# Patient Record
Sex: Male | Born: 1935 | Race: White | Marital: Married | Smoking: Never smoker
Health system: Southern US, Community
[De-identification: ages and names within clinical notes are randomized; demographics above are authoritative.]

## PROBLEM LIST (undated history)

## (undated) DIAGNOSIS — E119 Type 2 diabetes mellitus without complications: Secondary | ICD-10-CM

## (undated) DIAGNOSIS — I10 Essential (primary) hypertension: Secondary | ICD-10-CM

## (undated) HISTORY — PX: BACK SURGERY: SHX140

## (undated) HISTORY — PX: OTHER SURGICAL HISTORY: SHX169

---

## 2013-03-23 ENCOUNTER — Ambulatory Visit
Admission: RE | Admit: 2013-03-23 | Discharge: 2013-03-23 | Disposition: A | Discharge status: Home / Self Care | Payer: Medicare Other | Source: Ambulatory Visit | Attending: Family Medicine | Admitting: Family Medicine

## 2013-03-23 ENCOUNTER — Other Ambulatory Visit: Payer: Self-pay | Admitting: Family Medicine

## 2013-03-23 DIAGNOSIS — R059 Cough, unspecified: Secondary | ICD-10-CM

## 2013-03-23 DIAGNOSIS — R05 Cough: Secondary | ICD-10-CM

## 2013-03-23 DIAGNOSIS — R0989 Other specified symptoms and signs involving the circulatory and respiratory systems: Secondary | ICD-10-CM

## 2016-07-19 ENCOUNTER — Ambulatory Visit (HOSPITAL_COMMUNITY)
Admission: EM | Admit: 2016-07-19 | Discharge: 2016-07-19 | Disposition: A | Discharge status: Home / Self Care | Payer: Medicare Other | Attending: Internal Medicine | Admitting: Internal Medicine

## 2016-07-19 ENCOUNTER — Encounter (HOSPITAL_COMMUNITY): Payer: Self-pay | Admitting: Emergency Medicine

## 2016-07-19 DIAGNOSIS — J209 Acute bronchitis, unspecified: Secondary | ICD-10-CM | POA: Diagnosis not present

## 2016-07-19 DIAGNOSIS — J019 Acute sinusitis, unspecified: Secondary | ICD-10-CM

## 2016-07-19 HISTORY — DX: Type 2 diabetes mellitus without complications: E11.9

## 2016-07-19 HISTORY — DX: Essential (primary) hypertension: I10

## 2016-07-19 MED ORDER — AZITHROMYCIN 250 MG PO TABS: 250.0000 mg | ORAL_TABLET | Freq: Every day | ORAL | 0 refills | Status: DC

## 2016-07-19 MED ORDER — ALBUTEROL SULFATE HFA 108 (90 BASE) MCG/ACT IN AERS: 2.0000 | INHALATION_SPRAY | RESPIRATORY_TRACT | 0 refills | Status: DC | PRN

## 2016-07-19 MED ORDER — PREDNISONE 50 MG PO TABS: 50.0000 mg | ORAL_TABLET | Freq: Every day | ORAL | 0 refills | Status: DC

## 2016-07-19 NOTE — ED Triage Notes (Signed)
[facility] a complaint of a cough that started as a sore throat 1 week ago. The patient stated that he now has a productive cough, congestion and nasal drainage. The patient also reported rib cage pain under his left arm that he believed to be caused from coughing.

## 2016-07-19 NOTE — ED Provider Notes (Signed)
[facility]    CSN: [id number] Arrival date & time: 07/19/16  1521     History   Chief Complaint Chief Complaint  Patient presents with  . Cough    HPI Sean Barrett is a 82 y.o. male. He has a history of bronchitis. He presents today with a 10 day history of coughing spells, chest tightness, runny/congested nose. Sore throat initially. No fever. Still having malaise, fatigue. Hurts in his left chest with cough. [place] from [place], and does not have an inhaler with him.  HPI  Past Medical History:  Diagnosis Date  . Diabetes mellitus without complication (HCC)   . Hypertension     Past Surgical History:  Procedure Laterality Date  . BACK SURGERY    . rotator cuff surgery         Home Medications    Prior to Admission medications   Medication Sig Start Date End Date Taking? Authorizing Provider  amLODipine (NORVASC) 10 MG tablet Take 10 mg by mouth daily.   Yes Historical Provider, MD  aspirin EC 81 MG tablet Take 81 mg by mouth daily.   Yes Historical Provider, MD  atorvastatin (LIPITOR) 40 MG tablet Take 40 mg by mouth daily.   Yes Historical Provider, MD  carvedilol (COREG) 12.5 MG tablet Take 12.5 mg by mouth 2 (two) times daily with a meal.   Yes Historical Provider, MD  clopidogrel (PLAVIX) 75 MG tablet Take 75 mg by mouth daily.   Yes Historical Provider, MD  gabapentin (NEURONTIN) 300 MG capsule Take 300 mg by mouth 3 (three) times daily.   Yes Historical Provider, MD  metFORMIN (GLUCOPHAGE) 1000 MG tablet Take 1,000 mg by mouth 2 (two) times daily with a meal.   Yes Historical Provider, MD  MULTIPLE VITAMIN PO Take by mouth.   Yes Historical Provider, MD  vitamin B-12 (CYANOCOBALAMIN) 1000 MCG tablet Take 1,000 mcg by mouth every other day.   Yes Historical Provider, MD  albuterol (PROVENTIL HFA;VENTOLIN HFA) 108 (90 Base) MCG/ACT inhaler Inhale 2 puffs into the lungs every 4 (four) hours as needed for wheezing or shortness of breath.  07/19/16   Eustace MooreLaura W Cashton Hosley, MD  azithromycin (ZITHROMAX) 250 MG tablet Take 1 tablet (250 mg total) by mouth daily. Take first 2 tablets together, then 1 every day until finished. 07/19/16   Eustace MooreLaura W Marrion Finan, MD  predniSONE (DELTASONE) 50 MG tablet Take 1 tablet (50 mg total) by mouth daily. 07/19/16   Eustace MooreLaura W Deniqua Perry, MD    Family History History reviewed. No pertinent family history.  Social History Social History  Substance Use Topics  . Smoking status: Never Smoker  . Smokeless tobacco: Never Used  . Alcohol use No     Allergies   Tenex [guanfacine]   Review of Systems Review of Systems  All other systems reviewed and are negative.    Physical Exam Triage Vital Signs ED Triage Vitals  Enc Vitals Group     BP 07/19/16 1659 173/69     Pulse Rate 07/19/16 1659 71     Resp 07/19/16 1659 20     Temp 07/19/16 1659 98 F (36.7 C)     Temp Source 07/19/16 1659 Oral     SpO2 07/19/16 1659 100 %     Weight --      Height --      Pain Score 07/19/16 1704 5   Updated Vital Signs BP 173/69 (BP Location: Left Arm)   Pulse 71  Temp 98 F (36.7 C) (Oral)   Resp 20   SpO2 100%  Physical Exam  Constitutional: He is oriented to person, place, and time. No distress.  Alert, nicely groomed  HENT:  Head: Atraumatic.  Bilateral ears are waxy and TMs are quite dull, no erythema Marked nasal congestion, left greater than right Throat is red with postnasal drainage  Eyes:  Conjugate gaze, no eye redness/drainage  Neck: Neck supple.  Cardiovascular: Normal rate and regular rhythm.   Pulmonary/Chest: No respiratory distress.  Coarse but symmetric breath sounds throughout Deep breath causes coughing  Abdominal: He exhibits no distension.  Musculoskeletal: Normal range of motion.  Neurological: He is alert and oriented to person, place, and time.  Skin: Skin is warm and dry.  No cyanosis  Nursing note and vitals reviewed.    UC Treatments / Results    Procedures Procedures (including critical care time)      None today  Final Clinical Impressions(s) / UC Diagnoses   Final diagnoses:  Acute bronchitis, unspecified organism  Acute sinusitis with symptoms > 10 days   Anticipate gradual improvement in cough/congestion over the next several days.  Cough may take a couple weeks to completely resolve.  Recheck for new fever>100.5, increasing phlegm production, or if not starting to improve in a few days.  New Prescriptions New Prescriptions   ALBUTEROL (PROVENTIL HFA;VENTOLIN HFA) 108 (90 BASE) MCG/ACT INHALER    Inhale 2 puffs into the lungs every 4 (four) hours as needed for wheezing or shortness of breath.   AZITHROMYCIN (ZITHROMAX) 250 MG TABLET    Take 1 tablet (250 mg total) by mouth daily. Take first 2 tablets together, then 1 every day until finished.   PREDNISONE (DELTASONE) 50 MG TABLET    Take 1 tablet (50 mg total) by mouth daily.     Eustace Moore, MD 07/22/16 [phone]

## 2016-07-19 NOTE — Discharge Instructions (Addendum)
Anticipate gradual improvement in cough/congestion over the next several days.  Cough may take a couple weeks to completely resolve.  Recheck for new fever>100.5, increasing phlegm production, or if not starting to improve in a few days.

## 2017-02-12 ENCOUNTER — Encounter (HOSPITAL_COMMUNITY): Payer: Self-pay | Admitting: Emergency Medicine

## 2017-02-12 ENCOUNTER — Ambulatory Visit (HOSPITAL_COMMUNITY)
Admission: EM | Admit: 2017-02-12 | Discharge: 2017-02-12 | Disposition: A | Discharge status: Home / Self Care | Payer: Medicare Other | Attending: Internal Medicine | Admitting: Internal Medicine

## 2017-02-12 DIAGNOSIS — Z889 Allergy status to unspecified drugs, medicaments and biological substances status: Secondary | ICD-10-CM | POA: Diagnosis not present

## 2017-02-12 DIAGNOSIS — R42 Dizziness and giddiness: Secondary | ICD-10-CM | POA: Diagnosis not present

## 2017-02-12 DIAGNOSIS — I10 Essential (primary) hypertension: Secondary | ICD-10-CM

## 2017-02-12 MED ORDER — MECLIZINE HCL 12.5 MG PO TABS: 12.5000 mg | ORAL_TABLET | Freq: Three times a day (TID) | ORAL | 0 refills | Status: AC | PRN

## 2017-02-12 NOTE — Discharge Instructions (Signed)
Nice to meet you. Your exam is normal for Neuro causes though that is not always 100% however based on your presentation appears most indicative of vertigo vs. Allergies and ear fluid.  May use 1/2-1 of the Antivert for dizziness every 8 hours as needed. Caution as may cause sleepiness.  Also add back Claritin  daily and may use Mucinex (plain) as directed this will help your ear fluid.  If you become worse, with headaches or changes in vision or weakness, please go to the ER for further evaluation.

## 2017-02-12 NOTE — ED Provider Notes (Signed)
CSN: [id number]     Arrival date & time 02/12/17  1145 History   First MD Initiated Contact with Patient 02/12/17 1313     Chief Complaint  Patient presents with  . Dizziness   (Consider location/radiation/quality/duration/timing/severity/associated sxs/prior Treatment) 83 yo male presents with episodic dizziness x 2 weeks. Patient feels dizzy and nauseas when moving his head and feels that his "ears are full". No prior history of dizziness. +history of allergies, off his Medications for 2-3 months. Some post nasal drip is noted. Otherwise he feels well. He denies change in vision, weakness or paraesthesias. He has a chronic tremor of his neck and extremities which are unchanged. See remainder of ROS. No injury has preceeded this issue.        Past Medical History:  Diagnosis Date  . Diabetes mellitus without complication (HCC)   . Hypertension    Past Surgical History:  Procedure Laterality Date  . BACK SURGERY    . rotator cuff surgery     History reviewed. No pertinent family history. Social History  Substance Use Topics  . Smoking status: Never Smoker  . Smokeless tobacco: Never Used  . Alcohol use No    Review of Systems  Constitutional: Negative for fatigue, fever and unexpected weight change.  HENT: Positive for ear pain and postnasal drip. Negative for congestion, rhinorrhea and sinus pressure.   Eyes: Positive for discharge and itching.  Cardiovascular: Negative.   Neurological: Positive for dizziness, tremors and light-headedness. Negative for seizures, syncope, facial asymmetry, speech difficulty, weakness, numbness and headaches.  Psychiatric/Behavioral: Negative.  Negative for confusion, sleep disturbance and suicidal ideas. The patient is not nervous/anxious.     Allergies  Tenex [guanfacine]  Home Medications   Prior to Admission medications   Medication Sig Start Date End Date Taking? Authorizing Provider  amLODipine (NORVASC) 10 MG tablet Take 10 mg by  mouth daily.    [provider]  aspirin EC 81 MG tablet Take 81 mg by mouth daily.    [provider]  atorvastatin (LIPITOR) 40 MG tablet Take 40 mg by mouth daily.    [provider]  carvedilol (COREG) 12.5 MG tablet Take 12.5 mg by mouth 2 (two) times daily with a meal.    [provider]  clopidogrel (PLAVIX) 75 MG tablet Take 75 mg by mouth daily.    [provider]  gabapentin (NEURONTIN) 300 MG capsule Take 300 mg by mouth 3 (three) times daily.    [provider]  meclizine (ANTIVERT) 12.5 MG tablet Take 1 tablet (12.5 mg total) by mouth 3 (three) times daily as needed for dizziness. 02/12/17   Riki Sheer, PA-C  metFORMIN (GLUCOPHAGE) 1000 MG tablet Take 1,000 mg by mouth 2 (two) times daily with a meal.    [provider]  MULTIPLE VITAMIN PO Take by mouth.    [provider]  vitamin B-12 (CYANOCOBALAMIN) 1000 MCG tablet Take 1,000 mcg by mouth every other day.    [provider]   Meds Ordered and Administered this Visit  Medications - No data to display  BP (!) 151/57 (BP Location: Right Arm)   Pulse (!) 59   Temp 98 F (36.7 C) (Oral)   Resp 16   SpO2 98%  No data found.   Physical Exam  Constitutional: He is oriented to person, place, and time. He appears well-developed and well-nourished. No distress.  HENT:  Head: Normocephalic and atraumatic.  Cerumen bilaterally with small retro TM fluid,  no retractations or erythema are noted  Eyes: EOM are normal. Pupils are equal, round, and reactive to light. Right eye exhibits discharge. Left eye exhibits discharge.  Clear drainage (mild and watery) bilaterally  Neck: Normal range of motion. Neck supple.  Cardiovascular: Normal rate and regular rhythm.   Pulmonary/Chest: Effort normal and breath sounds normal.  Neurological: He is alert and oriented to person, place, and time. No cranial nerve deficit. He exhibits normal muscle tone.  Coordination normal.  Skin: Skin is warm and dry. He is not diaphoretic.  Psychiatric: His behavior is normal.  Nursing note and vitals reviewed.   Urgent Care Course     Procedures (including critical care time)  Labs Review Labs Reviewed - No data to display  Imaging Review No results found.   Visual Acuity Review  Right Eye Distance:   Left Eye Distance:   Bilateral Distance:    Right Eye Near:   Left Eye Near:    Bilateral Near:         MDM   1. Dizziness   2. H/O seasonal allergies   3. Essential hypertension    1. Suspect mild vertigo vs. Allergies-Attempted Dix hall-pike procedure without reproduction of nystagmus, but some mild nausea and no resolution of symptoms. Ears with fullness with clear fluid. No neuro deficit is noted on exam which is reassuring.  Suggest a trial of Antivert as needed. Restart Claritin daily for some allergies noted by exam. We discussed f/u with his ENT if continues or certainly if worsens with Neurologic symptoms then go to the ED. FU with his PCP for essential HTN.     Riki SheerYoung, Donyell Carrell G, [place]-C 02/12/17 [phone]

## 2017-02-12 NOTE — ED Triage Notes (Signed)
[facility] onset of dizziness that has caused some nasuea x 2 weeks. The patient reported that it is worse when he moves his head and he has some ear fullness as well.

## 2019-10-02 ENCOUNTER — Emergency Department (HOSPITAL_COMMUNITY): Payer: Medicare Other

## 2019-10-02 ENCOUNTER — Observation Stay (HOSPITAL_COMMUNITY): Payer: Medicare Other

## 2019-10-02 ENCOUNTER — Observation Stay (HOSPITAL_COMMUNITY)
Admission: EM | Admit: 2019-10-02 | Discharge: 2019-10-03 | Disposition: A | Discharge status: Home / Self Care | Payer: Medicare Other | Attending: Internal Medicine | Admitting: Internal Medicine

## 2019-10-02 DIAGNOSIS — E119 Type 2 diabetes mellitus without complications: Secondary | ICD-10-CM | POA: Diagnosis not present

## 2019-10-02 DIAGNOSIS — R29810 Facial weakness: Secondary | ICD-10-CM | POA: Insufficient documentation

## 2019-10-02 DIAGNOSIS — I10 Essential (primary) hypertension: Secondary | ICD-10-CM | POA: Insufficient documentation

## 2019-10-02 DIAGNOSIS — R55 Syncope and collapse: Principal | ICD-10-CM | POA: Diagnosis present

## 2019-10-02 DIAGNOSIS — R110 Nausea: Secondary | ICD-10-CM | POA: Insufficient documentation

## 2019-10-02 DIAGNOSIS — R269 Unspecified abnormalities of gait and mobility: Secondary | ICD-10-CM

## 2019-10-02 DIAGNOSIS — M545 Low back pain: Secondary | ICD-10-CM | POA: Insufficient documentation

## 2019-10-02 DIAGNOSIS — R200 Anesthesia of skin: Secondary | ICD-10-CM | POA: Diagnosis not present

## 2019-10-02 DIAGNOSIS — G450 Vertebro-basilar artery syndrome: Secondary | ICD-10-CM | POA: Insufficient documentation

## 2019-10-02 DIAGNOSIS — Z8673 Personal history of transient ischemic attack (TIA), and cerebral infarction without residual deficits: Secondary | ICD-10-CM | POA: Diagnosis not present

## 2019-10-02 DIAGNOSIS — I4891 Unspecified atrial fibrillation: Secondary | ICD-10-CM | POA: Diagnosis not present

## 2019-10-02 DIAGNOSIS — Z79899 Other long term (current) drug therapy: Secondary | ICD-10-CM | POA: Insufficient documentation

## 2019-10-02 DIAGNOSIS — E785 Hyperlipidemia, unspecified: Secondary | ICD-10-CM | POA: Diagnosis not present

## 2019-10-02 DIAGNOSIS — Z7984 Long term (current) use of oral hypoglycemic drugs: Secondary | ICD-10-CM | POA: Diagnosis not present

## 2019-10-02 DIAGNOSIS — W19XXXA Unspecified fall, initial encounter: Secondary | ICD-10-CM | POA: Diagnosis not present

## 2019-10-02 DIAGNOSIS — Z20828 Contact with and (suspected) exposure to other viral communicable diseases: Secondary | ICD-10-CM | POA: Insufficient documentation

## 2019-10-02 DIAGNOSIS — Z7982 Long term (current) use of aspirin: Secondary | ICD-10-CM | POA: Insufficient documentation

## 2019-10-02 DIAGNOSIS — Z7902 Long term (current) use of antithrombotics/antiplatelets: Secondary | ICD-10-CM | POA: Diagnosis not present

## 2019-10-02 LAB — COMPREHENSIVE METABOLIC PANEL
ALT: 29 U/L (ref 0–44)
AST: 25 U/L (ref 15–41)
Albumin: 3.8 g/dL (ref 3.5–5.0)
Alkaline Phosphatase: 76 U/L (ref 38–126)
Anion gap: 10 (ref 5–15)
BUN: 24 mg/dL — ABNORMAL HIGH (ref 8–23)
CO2: 27 mmol/L (ref 22–32)
Calcium: 9.3 mg/dL (ref 8.9–10.3)
Chloride: 100 mmol/L (ref 98–111)
Creatinine, Ser: 1.36 mg/dL — ABNORMAL HIGH (ref 0.61–1.24)
GFR calc Af Amer: 55 mL/min — ABNORMAL LOW (ref >60)
GFR calc non Af Amer: 48 mL/min — ABNORMAL LOW (ref >60)
Glucose, Bld: 261 mg/dL — ABNORMAL HIGH (ref 70–99)
Potassium: 4.1 mmol/L (ref 3.5–5.1)
Sodium: 137 mmol/L (ref 135–145)
Total Bilirubin: 1.1 mg/dL (ref 0.3–1.2)
Total Protein: 6.4 g/dL — ABNORMAL LOW (ref 6.5–8.1)

## 2019-10-02 LAB — I-STAT CHEM 8, ED
BUN: 26 mg/dL — ABNORMAL HIGH (ref 8–23)
Calcium, Ion: 1.19 mmol/L (ref 1.15–1.40)
Chloride: 98 mmol/L (ref 98–111)
Creatinine, Ser: 1.2 mg/dL (ref 0.61–1.24)
Glucose, Bld: 256 mg/dL — ABNORMAL HIGH (ref 70–99)
HCT: 47 % (ref 39.0–52.0)
Hemoglobin: 16 g/dL (ref 13.0–17.0)
Potassium: 4 mmol/L (ref 3.5–5.1)
Sodium: 138 mmol/L (ref 135–145)
TCO2: 30 mmol/L (ref 22–32)

## 2019-10-02 LAB — DIFFERENTIAL
Abs Immature Granulocytes: 0.03 10*3/uL (ref 0.00–0.07)
Basophils Absolute: 0 10*3/uL (ref 0.0–0.1)
Basophils Relative: 1 %
Eosinophils Absolute: 0.3 10*3/uL (ref 0.0–0.5)
Eosinophils Relative: 3 %
Immature Granulocytes: 0 %
Lymphocytes Relative: 30 %
Lymphs Abs: 2.4 10*3/uL (ref 0.7–4.0)
Monocytes Absolute: 0.5 10*3/uL (ref 0.1–1.0)
Monocytes Relative: 7 %
Neutro Abs: 4.8 10*3/uL (ref 1.7–7.7)
Neutrophils Relative %: 59 %

## 2019-10-02 LAB — CBC
HCT: 45.8 % (ref 39.0–52.0)
Hemoglobin: 15.5 g/dL (ref 13.0–17.0)
MCH: 31.3 pg (ref 26.0–34.0)
MCHC: 33.8 g/dL (ref 30.0–36.0)
MCV: 92.5 fL (ref 80.0–100.0)
Platelets: 279 10*3/uL (ref 150–400)
RBC: 4.95 MIL/uL (ref 4.22–5.81)
RDW: 12.5 % (ref 11.5–15.5)
WBC: 8.1 10*3/uL (ref 4.0–10.5)
nRBC: 0 % (ref 0.0–0.2)

## 2019-10-02 LAB — CBG MONITORING, ED: Glucose-Capillary: 161 mg/dL — ABNORMAL HIGH (ref 70–99)

## 2019-10-02 LAB — PROTIME-INR
INR: 1.1 (ref 0.8–1.2)
Prothrombin Time: 14 seconds (ref 11.4–15.2)

## 2019-10-02 LAB — GLUCOSE, CAPILLARY: Glucose-Capillary: 143 mg/dL — ABNORMAL HIGH (ref 70–99)

## 2019-10-02 LAB — APTT: aPTT: 32 seconds (ref 24–36)

## 2019-10-02 LAB — SARS CORONAVIRUS 2 (TAT 6-24 HRS): SARS Coronavirus 2: NEGATIVE

## 2019-10-02 MED ORDER — SENNOSIDES-DOCUSATE SODIUM 8.6-50 MG PO TABS: 1.0000 | ORAL_TABLET | Freq: Every evening | ORAL | Status: DC | PRN

## 2019-10-02 MED ORDER — ACETAMINOPHEN 160 MG/5ML PO SOLN: 650.0000 mg | ORAL | Status: DC | PRN

## 2019-10-02 MED ORDER — STROKE: EARLY STAGES OF RECOVERY BOOK: Freq: Once | Status: DC

## 2019-10-02 MED ORDER — MULTIPLE VITAMIN PO TABS: ORAL_TABLET | Freq: Every morning | ORAL | Status: DC

## 2019-10-02 MED ORDER — CLOPIDOGREL BISULFATE 75 MG PO TABS
75.0000 mg | ORAL_TABLET | Freq: Every day | ORAL | Status: DC
  Administered 2019-10-03: 08:00:00 75 mg via ORAL
  Filled 2019-10-02: qty 1

## 2019-10-02 MED ORDER — ACETAMINOPHEN 325 MG PO TABS: 650.0000 mg | ORAL_TABLET | ORAL | Status: DC | PRN

## 2019-10-02 MED ORDER — ADULT MULTIVITAMIN W/MINERALS CH
1.0000 | ORAL_TABLET | Freq: Every day | ORAL | Status: DC
  Administered 2019-10-03: 08:00:00 1 via ORAL
  Filled 2019-10-02: qty 1

## 2019-10-02 MED ORDER — GABAPENTIN 300 MG PO CAPS: 300.0000 mg | ORAL_CAPSULE | Freq: Three times a day (TID) | ORAL | Status: DC

## 2019-10-02 MED ORDER — ENOXAPARIN SODIUM 40 MG/0.4ML ~~LOC~~ SOLN
40.0000 mg | SUBCUTANEOUS | Status: DC
  Administered 2019-10-02 – 2019-10-03 (×2): 40 mg via SUBCUTANEOUS
  Filled 2019-10-02 (×2): qty 0.4

## 2019-10-02 MED ORDER — INSULIN ASPART 100 UNIT/ML ~~LOC~~ SOLN
0.0000 [IU] | Freq: Three times a day (TID) | SUBCUTANEOUS | Status: DC
  Administered 2019-10-03 (×2): 2 [IU] via SUBCUTANEOUS

## 2019-10-02 MED ORDER — ASPIRIN 325 MG PO TABS
325.0000 mg | ORAL_TABLET | Freq: Every day | ORAL | Status: DC
  Administered 2019-10-02 – 2019-10-03 (×2): 325 mg via ORAL
  Filled 2019-10-02 (×2): qty 1

## 2019-10-02 MED ORDER — ATORVASTATIN CALCIUM 80 MG PO TABS: 80.0000 mg | ORAL_TABLET | Freq: Every day | ORAL | Status: DC

## 2019-10-02 MED ORDER — SODIUM CHLORIDE 0.9% FLUSH
3.0000 mL | Freq: Once | INTRAVENOUS | Status: AC
  Administered 2019-10-02: 13:00:00 3 mL via INTRAVENOUS

## 2019-10-02 MED ORDER — IOHEXOL 350 MG/ML SOLN
75.0000 mL | Freq: Once | INTRAVENOUS | Status: AC | PRN
  Administered 2019-10-02: 75 mL via INTRAVENOUS

## 2019-10-02 MED ORDER — ACETAMINOPHEN 650 MG RE SUPP: 650.0000 mg | RECTAL | Status: DC | PRN

## 2019-10-02 MED ORDER — VITAMIN B-12 1000 MCG PO TABS: 1000.0000 ug | ORAL_TABLET | ORAL | Status: DC

## 2019-10-02 MED ORDER — INSULIN ASPART 100 UNIT/ML ~~LOC~~ SOLN: 0.0000 [IU] | Freq: Every day | SUBCUTANEOUS | Status: DC

## 2019-10-02 NOTE — ED Notes (Signed)
Pharmacy tech reported to this RN that patient's son reported to pharmacy tech that he heard pt's head hit the floor when he fell.

## 2019-10-02 NOTE — Progress Notes (Signed)
EEG complete - results pending 

## 2019-10-02 NOTE — ED Triage Notes (Addendum)
To ED via [facility] from son's house-- visiting from [place]-- had a syncopal episode while standing at counter-- no hx of this Has a small abrasion on left occipital area- no bleeding--  Speech clear-- equal grips, no drift in arms-- states that he was too weak to raise arms when EMS was at the house   Has left facial droop and numbness in left side of face  Partial hearing loss in right ear-- hx of

## 2019-10-02 NOTE — ED Provider Notes (Signed)
[facility] EMERGENCY DEPARTMENT Provider Note   CSN: [id number] Arrival date & time:     An emergency department physician performed an initial assessment on this suspected stroke patient at 34.  History Chief Complaint  Patient presents with  . Code Stroke    Sean Barrett is a 85 y.o. male.  HPI Patient presented to me as a code stroke.  Stroke called by EMS.  Reportedly had been standing at home at the counter.  He is visiting [place].  Had a syncopal episode.  States he felt nauseous before he fell.  Did not feel his heart racing or going slow.  Had difficulty raising his arms.  Per EMS had facial droop on the left and numbness on left side of face.  No headache.  He is on Plavix.  Now complaining of pain in his lower back.  No history of atrial fibrillation.  States he did have a narrowing in the back of his head before there was talk of a stent but was not done.  States he does have a rod in his back.    Past Medical History:  Diagnosis Date  . Diabetes mellitus without complication (HCC)   . Hypertension     There are no problems to display for this patient.   Past Surgical History:  Procedure Laterality Date  . BACK SURGERY    . rotator cuff surgery         No family history on file.  Social History   Tobacco Use  . Smoking status: Never Smoker  . Smokeless tobacco: Never Used  Substance Use Topics  . Alcohol use: No  . Drug use: No    Home Medications Prior to Admission medications   Medication Sig Start Date End Date Taking? Authorizing Provider  amLODipine (NORVASC) 10 MG tablet Take 10 mg by mouth daily.    [provider]  aspirin EC 81 MG tablet Take 81 mg by mouth daily.    [provider]  atorvastatin (LIPITOR) 40 MG tablet Take 40 mg by mouth daily.    [provider]  carvedilol (COREG) 12.5 MG tablet Take 12.5 mg by mouth 2 (two) times daily with a meal.    [provider]  clopidogrel  (PLAVIX) 75 MG tablet Take 75 mg by mouth daily.    [provider]  gabapentin (NEURONTIN) 300 MG capsule Take 300 mg by mouth 3 (three) times daily.    [provider]  meclizine (ANTIVERT) 12.5 MG tablet Take 1 tablet (12.5 mg total) by mouth 3 (three) times daily as needed for dizziness. 02/12/17   Riki Sheer, PA-C  metFORMIN (GLUCOPHAGE) 1000 MG tablet Take 1,000 mg by mouth 2 (two) times daily with a meal.    [provider]  MULTIPLE VITAMIN PO Take by mouth.    [provider]  vitamin B-12 (CYANOCOBALAMIN) 1000 MCG tablet Take 1,000 mcg by mouth every other day.    [provider]    Allergies    Tenex [guanfacine]  Review of Systems   Review of Systems  Constitutional: Negative for appetite change.  HENT: Negative for congestion.   Respiratory: Negative for shortness of breath.   Cardiovascular: Negative for chest pain.  Gastrointestinal: Positive for nausea. Negative for abdominal pain.  Musculoskeletal: Positive for back pain.  Skin: Negative for rash.  Neurological: Positive for syncope.  Psychiatric/Behavioral: Negative for confusion.    Physical Exam Updated Vital Signs BP 111/61   Pulse  67   Resp 13   Wt 80.1 kg   SpO2 98%   Physical Exam Vitals and nursing note reviewed.  HENT:     Head: Normocephalic.  Eyes:     Pupils: Pupils are equal, round, and reactive to light.  Cardiovascular:     Rate and Rhythm: Normal rate and regular rhythm.  Pulmonary:     Breath sounds: Normal breath sounds.  Musculoskeletal:     Cervical back: Neck supple.     Comments: Tenderness over lumbar spine.  Skin:    General: Skin is warm.     Capillary Refill: Capillary refill takes less than 2 seconds.  Neurological:     Mental Status: He is alert and oriented to person, place, and time.     Comments: Awake and pleasant.  Some wavering with straight leg raise on the left side.  Good on the right.  Grip strength symmetric.   Face symmetric.  Sensation symmetric.  Complete NIH scoring done by neurology     ED Results / Procedures / Treatments   Labs (all labs ordered are listed, but only abnormal results are displayed) Labs Reviewed  COMPREHENSIVE METABOLIC PANEL - Abnormal; Notable for the following components:      Result Value   Glucose, Bld 261 (*)    BUN 24 (*)    Creatinine, Ser 1.36 (*)    Total Protein 6.4 (*)    GFR calc non Af Amer 48 (*)    GFR calc Af Amer 55 (*)    All other components within normal limits  I-STAT CHEM 8, ED - Abnormal; Notable for the following components:   BUN 26 (*)    Glucose, Bld 256 (*)    All other components within normal limits  PROTIME-INR  APTT  CBC  DIFFERENTIAL  CBG MONITORING, ED    EKG EKG Interpretation  Date/Time:  Monday October 02 2019 12:00:21 EST Ventricular Rate:  68 PR Interval:    QRS Duration: 78 QT Interval:  572 QTC Calculation: 609 R Axis:   11 Text Interpretation: Atrial fibrillation Abnormal R-wave progression, early transition Borderline T wave abnormalities Prolonged QT interval Confirmed by Benjiman CorePickering, Zaniah Titterington [phone]) on 10/02/2019 12:39:45 PM   Radiology CT Code Stroke CTA Head W/WO contrast  Result Date: 10/02/2019 CLINICAL DATA:  Stroke, follow-up. EXAM: CT ANGIOGRAPHY HEAD AND NECK TECHNIQUE: Multidetector CT imaging of the head and neck was performed using the standard protocol during bolus administration of intravenous contrast. Multiplanar CT image reconstructions and MIPs were obtained to evaluate the vascular anatomy. Carotid stenosis measurements (when applicable) are obtained utilizing NASCET criteria, using the distal internal carotid diameter as the denominator. CONTRAST:  75mL OMNIPAQUE IOHEXOL 350 MG/ML SOLN COMPARISON:  Noncontrast head CT performed earlier the same day 10/02/2019 FINDINGS: CTA NECK FINDINGS Aortic arch: Standard aortic branching. Scattered soft and calcified plaque within the visualized aortic arch  and proximal major branch vessels of the neck. Right carotid system: CCA patent without stenosis. Prominent soft and calcified plaque at the carotid bifurcation and within the proximal ICA. There is resultant 50-60% stenosis at the origin of the ICA. Calcified plaques also present within the distal cervical ICA, although without significant narrowing at this site. Left carotid system: CCA patent without stenosis. Moderate soft and calcified plaque at the carotid bifurcation and within the proximal ICA. Resultant less than 50% stenosis within the proximal ICA. Distal to this, the ICA is patent within the neck without stenosis. Vertebral arteries: The left vertebral artery  is slightly dominant. Calcified plaque at the origin of the non dominant right vertebral artery results in at least moderate/severe ostial stenosis. Calcified plaque also present at the origin of the dominant left vertebral artery without significant ostial stenosis. More distally the bilateral vertebral arteries are patent within the neck without significant stenosis. Skeleton: No acute bony abnormality. Cervical spondylosis without high-grade bony spinal canal narrowing. Other neck: No soft tissue neck mass or cervical lymphadenopathy. Upper chest: No consolidation within the imaged lung apices. Review of the MIP images confirms the above findings CTA HEAD FINDINGS Anterior circulation: There is dense calcified plaque throughout the bilateral internal carotid artery siphons. There is moderate/severe narrowing of the paraclinoid segments bilaterally (greater on the left). The M1 right middle cerebral artery is patent without significant stenosis. Atherosclerotic irregularity of right M2 branches. Most notably, there is a moderate focal stenosis within a proximal to mid M2 right MCA branch (series 12, image 13). The right anterior cerebral artery is patent without significant proximal stenosis. The M1 left middle cerebral artery is patent without  significant stenosis. Atherosclerotic irregularity of M2 left MCA branches. This includes mild-to-moderate stenosis at the origin of a left M2 MCA branch vessel (series 11, image 20). The left anterior cerebral artery is patent without significant proximal stenosis. No intracranial aneurysm is identified. Posterior circulation: Mixed plaque throughout the intracranial vertebral arteries bilaterally. Sites of up to moderate stenosis within the dominant intracranial left vertebral artery. Sites of up to moderate/severe stenosis within the intracranial right vertebral artery. The basilar artery is patent without significant stenosis. The right posterior cerebral artery is patent. Moderate atherosclerotic irregularity throughout the majority of the P2 right posterior cerebral artery. The distal P1 left posterior cerebral artery is poorly delineated as are much of the P2 and P3 left PCA segments. Findings consistent with high-grade stenosis and/or partial occlusion. There is some flow seen within the distal left posterior cerebral artery. Faint enhancement within a small left posterior communicating artery is visualized. No right posterior communicating artery is seen. Venous sinuses: Within limitations of contrast timing, no convincing thrombus. Anatomic variants: As described Review of the MIP images confirms the above findings IMPRESSION: CTA neck: 1. The common carotid, internal carotid and vertebral arteries are patent within the neck bilaterally. 2. Mixed plaque results in 50-60% stenosis within the proximal right ICA, and less than 50% stenosis of the proximal left ICA. 3. Calcified plaque results in moderate/severe ostial stenosis at the origin of the non-dominant right vertebral artery. CTA head: 1. Intracranial atherosclerotic disease, most notably as follows. 2. The distal P1 left posterior cerebral artery and much of the P2 and P3 left PCA segments are poorly delineated. Findings consistent with high-grade  stenosis and/or partial occlusion. Some enhancement is seen within the distal left PCA. 3. Moderate atherosclerotic irregularity of the P2 right PCA. 4. Mixed plaque within the intracranial vertebral arteries with sites of up to moderate/severe stenosis on the right, and up to moderate stenosis on the left. 5. Dense calcified plaque results in moderate/severe stenoses within the paraclinoid internal carotid arteries (greater on the left). 6. Moderate focal stenosis within a mid to distal M2 right MCA branch vessel. 7. Mild/moderate focal stenosis at the origin of an M2 left MCA branch vessel. Electronically Signed   By: Jackey Loge DO   On: 10/02/2019 12:08   CT Code Stroke CTA Neck W/WO contrast  Result Date: 10/02/2019 CLINICAL DATA:  Stroke, follow-up. EXAM: CT ANGIOGRAPHY HEAD AND NECK TECHNIQUE: Multidetector CT imaging  of the head and neck was performed using the standard protocol during bolus administration of intravenous contrast. Multiplanar CT image reconstructions and MIPs were obtained to evaluate the vascular anatomy. Carotid stenosis measurements (when applicable) are obtained utilizing NASCET criteria, using the distal internal carotid diameter as the denominator. CONTRAST:  75mL OMNIPAQUE IOHEXOL 350 MG/ML SOLN COMPARISON:  Noncontrast head CT performed earlier the same day 10/02/2019 FINDINGS: CTA NECK FINDINGS Aortic arch: Standard aortic branching. Scattered soft and calcified plaque within the visualized aortic arch and proximal major branch vessels of the neck. Right carotid system: CCA patent without stenosis. Prominent soft and calcified plaque at the carotid bifurcation and within the proximal ICA. There is resultant 50-60% stenosis at the origin of the ICA. Calcified plaques also present within the distal cervical ICA, although without significant narrowing at this site. Left carotid system: CCA patent without stenosis. Moderate soft and calcified plaque at the carotid bifurcation and  within the proximal ICA. Resultant less than 50% stenosis within the proximal ICA. Distal to this, the ICA is patent within the neck without stenosis. Vertebral arteries: The left vertebral artery is slightly dominant. Calcified plaque at the origin of the non dominant right vertebral artery results in at least moderate/severe ostial stenosis. Calcified plaque also present at the origin of the dominant left vertebral artery without significant ostial stenosis. More distally the bilateral vertebral arteries are patent within the neck without significant stenosis. Skeleton: No acute bony abnormality. Cervical spondylosis without high-grade bony spinal canal narrowing. Other neck: No soft tissue neck mass or cervical lymphadenopathy. Upper chest: No consolidation within the imaged lung apices. Review of the MIP images confirms the above findings CTA HEAD FINDINGS Anterior circulation: There is dense calcified plaque throughout the bilateral internal carotid artery siphons. There is moderate/severe narrowing of the paraclinoid segments bilaterally (greater on the left). The M1 right middle cerebral artery is patent without significant stenosis. Atherosclerotic irregularity of right M2 branches. Most notably, there is a moderate focal stenosis within a proximal to mid M2 right MCA branch (series 12, image 13). The right anterior cerebral artery is patent without significant proximal stenosis. The M1 left middle cerebral artery is patent without significant stenosis. Atherosclerotic irregularity of M2 left MCA branches. This includes mild-to-moderate stenosis at the origin of a left M2 MCA branch vessel (series 11, image 20). The left anterior cerebral artery is patent without significant proximal stenosis. No intracranial aneurysm is identified. Posterior circulation: Mixed plaque throughout the intracranial vertebral arteries bilaterally. Sites of up to moderate stenosis within the dominant intracranial left vertebral  artery. Sites of up to moderate/severe stenosis within the intracranial right vertebral artery. The basilar artery is patent without significant stenosis. The right posterior cerebral artery is patent. Moderate atherosclerotic irregularity throughout the majority of the P2 right posterior cerebral artery. The distal P1 left posterior cerebral artery is poorly delineated as are much of the P2 and P3 left PCA segments. Findings consistent with high-grade stenosis and/or partial occlusion. There is some flow seen within the distal left posterior cerebral artery. Faint enhancement within a small left posterior communicating artery is visualized. No right posterior communicating artery is seen. Venous sinuses: Within limitations of contrast timing, no convincing thrombus. Anatomic variants: As described Review of the MIP images confirms the above findings IMPRESSION: CTA neck: 1. The common carotid, internal carotid and vertebral arteries are patent within the neck bilaterally. 2. Mixed plaque results in 50-60% stenosis within the proximal right ICA, and less than 50% stenosis of the  proximal left ICA. 3. Calcified plaque results in moderate/severe ostial stenosis at the origin of the non-dominant right vertebral artery. CTA head: 1. Intracranial atherosclerotic disease, most notably as follows. 2. The distal P1 left posterior cerebral artery and much of the P2 and P3 left PCA segments are poorly delineated. Findings consistent with high-grade stenosis and/or partial occlusion. Some enhancement is seen within the distal left PCA. 3. Moderate atherosclerotic irregularity of the P2 right PCA. 4. Mixed plaque within the intracranial vertebral arteries with sites of up to moderate/severe stenosis on the right, and up to moderate stenosis on the left. 5. Dense calcified plaque results in moderate/severe stenoses within the paraclinoid internal carotid arteries (greater on the left). 6. Moderate focal stenosis within a mid to  distal M2 right MCA branch vessel. 7. Mild/moderate focal stenosis at the origin of an M2 left MCA branch vessel. Electronically Signed   By: Jackey Loge DO   On: 10/02/2019 12:08   CT Lumbar Spine Wo Contrast  Result Date: 10/02/2019 CLINICAL DATA:  Low back pain after syncopal episode resulting in a fall today. Initial encounter. EXAM: CT LUMBAR SPINE WITHOUT CONTRAST TECHNIQUE: Multidetector CT imaging of the lumbar spine was performed without intravenous contrast administration. Multiplanar CT image reconstructions were also generated. COMPARISON:  None. FINDINGS: Segmentation: Standard. Alignment: Normal. Vertebrae: No acute fracture or focal pathologic process. The patient is status post L4-5 laminectomy and fusion with unilateral pedicle screws and stabilization bars in place on the left. No evidence of loosening are other complication. Paraspinal and other soft tissues: Atherosclerosis is noted. Otherwise negative. Disc levels: T9-10: Negative. T10-11: Negative. T11-12: Negative. T12-L1: Negative. L1-2: Shallow disc bulge without stenosis. L2-3: Shallow disc bulge and vacuum disc phenomenon. Mild endplate spur. The central canal is open. Mild to moderate foraminal narrowing is more notable on the left. L3-4: Shallow disc bulge and ligamentum flavum thickening. The central canal is open. Moderate bilateral foraminal narrowing. L4-5: Status post laminectomy and fusion.  No stenosis. L5-S1: Shallow disc bulge and mild facet degenerative change. No stenosis. IMPRESSION: Negative for fracture or other acute abnormality. The central canal appears open at all levels. Scattered foraminal narrowing is most notable at L3-4 where it appears moderate bilaterally. Electronically Signed   By: Drusilla Kanner M.D.   On: 10/02/2019 11:47   CT HEAD CODE STROKE WO CONTRAST  Result Date: 10/02/2019 CLINICAL DATA:  Code stroke. Focal neuro deficit, greater than 6 hours, stroke suspected. Additional history provided:  Last known normal 10:30 a.m., left leg weakness. EXAM: CT HEAD WITHOUT CONTRAST TECHNIQUE: Contiguous axial images were obtained from the base of the skull through the vertex without intravenous contrast. COMPARISON:  No pertinent prior studies available for comparison. FINDINGS: Brain: No evidence of acute intracranial hemorrhage. No demarcated cortical infarction. No evidence of intracranial mass. No midline shift or extra-axial fluid collection. Mild generalized parenchymal atrophy. Vascular: No definite hyperdense vessel. Atherosclerotic calcifications. Skull: Normal. Negative for fracture or focal lesion. Sinuses/Orbits: Visualized orbits demonstrate no acute abnormality. Mild scattered paranasal sinus mucosal thickening. No significant mastoid effusion. ASPECTS ([place] Stroke Program Early CT Score) - Ganglionic level infarction (caudate, lentiform nuclei, internal capsule, insula, M1-M3 cortex): 7 - Supraganglionic infarction (M4-M6 cortex): 3 Total score (0-10 with 10 being normal): 10 These results were called by telephone at the time of interpretation on 10/02/2019 at 11:25 am to provider Dr. Wilford Corner, who verbally acknowledged these results. IMPRESSION: No evidence of acute intracranial abnormality. Mild generalized parenchymal atrophy. Electronically Signed   By:  Kellie Simmering DO   On: 10/02/2019 11:27    Procedures Procedures (including critical care time)  Medications Ordered in ED Medications  sodium chloride flush (NS) 0.9 % injection 3 mL (3 mLs Intravenous Given 10/02/19 1246)  iohexol (OMNIPAQUE) 350 MG/ML injection 75 mL (75 mLs Intravenous Contrast Given 10/02/19 1125)    ED Course  I have reviewed the triage vital signs and the nursing notes.  Pertinent labs & imaging results that were available during my care of the patient were reviewed by me and considered in my medical decision making (see chart for details).    MDM Rules/Calculators/A&P                      Patient came  with syncopal episode.  After that reportedly had facial droop although none now.  Patient stated he was weak in both arms.  Also possible left leg weakness.  Does have some chronic back issues though.  CT of the lumbar spine reassuring.  Head CT reassuring but does have some likely chronic posterior vascular changes.  Neurology states not a TPA candidate.  Will need MRI.  However also found to have new onset atrial fibrillation.  CHA2DS2-VASc score of at least 5 for age hypertension TIA and diabetes.  Will admit to unassigned.  Patient's PCP is in [place].   Final Clinical Impression(s) / ED Diagnoses Final diagnoses:  Atrial fibrillation, unspecified type ([place])  Syncope, unspecified syncope type    Rx / DC Orders ED Discharge Orders    None       Davonna Belling, MD 10/02/19 1254

## 2019-10-02 NOTE — H&P (Signed)
Date: 10/02/2019               Patient Name:  Sean Barrett MRN: [id number]  DOB: [birth year] Age / Sex: 85 y.o., male   PCP: System, Pcp Not In         Medical Service: Internal Medicine Teaching Service         Attending Physician: Dr. Inez CatalinaMullen, Emily B, MD    First Contact: Dr. Thom ChimesEllen Toshua Honsinger Pager: [phone]  Second Contact: Dr. Lenward Chancellorarley Bloomfield Pager: [phone]       After Hours (After 5p/  First Contact Pager: [phone]  weekends / holidays): Second Contact Pager: [phone]   Chief Complaint: fall  History of Present Illness:  Sean Barrett is a 85 year old M with significant PMH of diabetes, HTN, HLD, and a prior CVA who presented after a fall this morning. Pt states he felt a bit dizzy upon waking up this morning, but was able to dress himself and do his usual morning routine (two sets of 25 sit-ups). After getting up from the floor, he walked to the kitchen and "felt very strange." Endorsed nausea and dizziness at this time, though denied chest pain, palpitations, vision changes, or focal weakness. He says he remembers bracing himself against the kitchen [place] and then the next thing he realized he was on the ground. He fell backwards and hit his head against the floor. Pt remembers his sister then calling over a neighbor, who subsequently called EMS. He states that when EMS arrived he was unable to lift up either of his arms.  Of note, pt describes having a stroke back in 2013 for which he was placed on plavix. He denies any residual deficits from this CVA and endorses needing a short course of 1-2 weeks in a rehab facility before going home. Pt says that at this time he was told he had some stenosis in the "back part of his brain," but this was not amenable to stenting at that time.  He was brought into the ED as a Code Stroke. NIHSS was 1 due to left leg weakness. CT head was negative. CTA was remarkable for poor deliniation of distal P1 left PCA artery and P2 & P3 segments consistent  with high grade stenosis and/or partial occulusion of . EKG revealed a fib. Neuro evaluated the pt and have a syncopal episode related to vertebrobasilar insufficiency. Internal medicine was called for admission.  Meds:  Current Meds  Medication Sig  . amLODipine (NORVASC) 10 MG tablet Take 10 mg by mouth daily.  Marland Kitchen. atorvastatin (LIPITOR) 20 MG tablet Take 20 mg by mouth daily.   . carvedilol (COREG) 12.5 MG tablet Take 12.5 mg by mouth 2 (two) times daily with a meal.  . clopidogrel (PLAVIX) 75 MG tablet Take 75 mg by mouth daily.  . hydrochlorothiazide (HYDRODIURIL) 12.5 MG tablet Take 12.5 mg by mouth at bedtime.  . metFORMIN (GLUCOPHAGE) 1000 MG tablet Take 500-1,000 mg by mouth See admin instructions. Take 1/2 tablet (500 mg) by mouth every morning and 1 tablet (1000 mg) at night  . Multiple Vitamin (MULTIVITAMIN WITH MINERALS) TABS tablet Take 1 tablet by mouth daily.   Allergies: Allergies as of 10/02/2019 - Review Complete 10/02/2019  Allergen Reaction Noted  . Tenex [guanfacine] Other (See Comments) 07/19/2016   Past Medical History:  Diagnosis Date  . Diabetes mellitus without complication (HCC)   . Hypertension     Family History:  Pt with many siblings. Twin sister died  of pancreatic cancer, and had a fib and diabetes. An older brother died of heart attack in 55s after being deemed not a candidate for valve replacement. One younger brother with a fib and a pacemaker.   Social History:  Pt lives independently in [place] in a 2 story townhouse. Is a widow. Was visiting his sister here in [place]. Smoked his last cigarette in 1980. Endorses occasional EtOH use, maybe 1 beer or glass of wine per week.   Review of Systems: Review of Systems  Constitutional: Negative for chills, fever and malaise/fatigue.  HENT: Negative for congestion and sore throat.   Eyes: Negative for blurred vision and double vision.  Respiratory: Negative for cough and shortness of breath.     Cardiovascular: Negative for chest pain, palpitations and leg swelling.  Gastrointestinal: Positive for nausea. Negative for abdominal pain and vomiting.  Genitourinary: Negative.   Musculoskeletal: Positive for falls. Negative for back pain and joint pain.  Skin: Negative.   Neurological: Positive for dizziness and loss of consciousness. Negative for sensory change, focal weakness and headaches.   Physical Exam: Blood pressure 126/64, pulse 76, resp. rate 13, weight 80.1 kg, SpO2 98 %. Physical Exam Vitals and nursing note reviewed.  Constitutional:      General: He is not in acute distress.    Appearance: He is not ill-appearing.  HENT:     Head: Normocephalic.     Comments: With abrasion over pt's posterior L side of scalp.    Mouth/Throat:     Mouth: Mucous membranes are dry.  Eyes:     Extraocular Movements: Extraocular movements intact.  Cardiovascular:     Rate and Rhythm: Normal rate and regular rhythm.     Heart sounds: Normal heart sounds. No murmur. No friction rub. No gallop.   Pulmonary:     Effort: Pulmonary effort is normal. No respiratory distress.     Breath sounds: Normal breath sounds. No wheezing, rhonchi or rales.  Abdominal:     General: Abdomen is flat. Bowel sounds are normal.     Palpations: Abdomen is soft.  Musculoskeletal:        General: No swelling, tenderness, deformity or signs of injury. Normal range of motion.     Cervical back: Normal range of motion and neck supple.     Right lower leg: No edema.     Left lower leg: No edema.  Skin:    General: Skin is warm and dry.     Findings: Abrasion (over pt's L side of scalp) present.  Neurological:     Mental Status: He is alert.     Cranial Nerves: No dysarthria or facial asymmetry.     Sensory: Sensation is intact.     Motor: Motor function is intact.     Comments: Pt moving all extremities spontaneously while in the bed. Strength 5/5 symmetrically in upper and lower extremities.   Psychiatric:        Mood and Affect: Mood normal.    EKG: personally reviewed my interpretation is normal rate, a fib.  Assessment & Plan by Problem: Active Problems:   Syncope  Mr. Guia is a 85 year old M with significant PMH of diabetes, HTN, HLD, and a prior CVA who presented after a syncopal episode, was brought to the ED as a code stroke, and was admitted to the hospital for further evaluation.  Syncope Pt presents after a syncopal episode this morning. He had prodromal symptoms of nausea and dizziness and was able  to brace himself on a kitchen island before loss of consciousness. Pt hit the back of his head on the floor and has a subsequent abrasion on exam. Per EMS, pt had facial droop and possible L leg weakness. Neuro evaluated pt in the ED for code stroke and only noted 4/5 weakness in LLE at knee and hip. CT head negative, though CTA with posterior circulation stenosis. Neurology consulted, appreciate recommendations - suspect syncopal episode in the setting of vertebrobasilar insufficiency due to stenosis posterior circulation with new a fib - MRI brain - echo - routine EED - A1c and lipid panel - q2h neuro checks - orthostatic vital signs - telemetry - permissive HTN for first 24-48hr, with goal of gradual normalization to SBP <140 upon discharge - ASA 325mg   - atorvastatin 80mg  PO daily - PT/OT/speech consult  A fib Pt states he has an extensive family history of atrial fibrillation from multiple siblings. EKG in the ED demonstrating a fib in addition to tele in the room, though pt sounds regular on auscultation. HR's remain in the 57s.  - repeat EKG in the AM - CHADS2-VASc score of 6, due to age and history of CVA, HTN, and diabetes - HAS-BLED score of 3 due to age, stroke history, and clopidogrel use - will hold anticoagulation acutely till MRI results - will discussion risk/benefits to anticoagulation with pt tomorrow   HTN Pt taking HCTZ 12.5mg , amlodipine  10mg , and carvedilol 12.5mg  daily. He was noted to have a softer BP on admission at 107/57. Question if pt's dizziness/syncope worsened by over treatment of his blood pressure, as he may no longer need 3 agents with age adjusted parameters and potential fall risk. - holding home anti-hypertensives  Diabetes Pt endorses taking metformin 1000mg  hs + 500mg  ac at home. States his last A1c was 6.2% and his metformin had been decreased from 2000mg  daily to 1500mg  daily at that time. - moderate + hs SSI  Diet - NPO pending bedside swallow Fluids - none DVT ppx - enoxaparin 40mg  subQ daily CODE STATUS - FULL CODE  Dispo: Admit patient to Observation with expected length of stay less than 2 midnights.  Signed: Ladona Horns, MD 10/02/2019, 2:43 PM  Pager: [phone]
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
## 2019-10-02 NOTE — Progress Notes (Signed)
Pt arrived on unit  Vitals documented, verified telemetry with CCMD Education provided on high fall risk, bed alarm set  Pt has all belongings in reach including call light  Will continue to monitor

## 2019-10-02 NOTE — ED Notes (Signed)
Walked patient to the bathroom patient did well 

## 2019-10-02 NOTE — Procedures (Signed)
Patient Name: Trygve Thal  MRN: [id number]  Epilepsy Attending: Lora Havens  Referring Physician/Provider: Dr Modena Nunnery Date: 10/02/2019 Duration: 22.51 mins  Patient history: 85yo M with syncopal episode. EEG to evaluate for seizure.  Level of alertness: awake, drowsy  AEDs during EEG study: None  Technical aspects: This EEG study was done with scalp electrodes positioned according to the 10-20 International system of electrode placement. Electrical activity was acquired at a sampling rate of 500Hz  and reviewed with a high frequency filter of 70Hz  and a low frequency filter of 1Hz . EEG data were recorded continuously and digitally stored.   DESCRIPTION: The posterior dominant rhythm consists of 8 Hz activity of moderate voltage (25-35 uV) seen predominantly in posterior head regions, symmetric and reactive to eye opening and eye closing. Drowsiness was characterized by attenuation of the posterior background rhythm. Hyperventilation and photic stimulation were not performed.  IMPRESSION: This study is within normal limits. No seizures or epileptiform discharges were seen throughout the recording.  Siris Hoos Barbra Sarks

## 2019-10-02 NOTE — Code Documentation (Signed)
85 yo male coming from his sister's home where he is visiting from [place]. Pt woke up this morning at his baseline and walked to the kitchen. Sister found patient down on the floor. Pt was woken up and noted to have some left sided weakness and lower back pain after syncope. [facility] called and brought patient to the ED. Taken to triage and triage activated Code Stroke. Stroke Team met patient at the bridge. NIHSS 1 due to left leg weakness - see stroke timeline for details and times. Left leg weakness noted with baseline sensory decreased. Taken to CT. CT/CTA completed. Pt deemed too mild to treat with tPA. Pt VAN - and did not have reason for intervention on CTA. Pt taken back to the ED. Remains in tPA window until 1500. Handoff given to [place], Therapist, sports.

## 2019-10-03 ENCOUNTER — Observation Stay (HOSPITAL_BASED_OUTPATIENT_CLINIC_OR_DEPARTMENT_OTHER): Payer: Medicare Other

## 2019-10-03 DIAGNOSIS — I6523 Occlusion and stenosis of bilateral carotid arteries: Secondary | ICD-10-CM | POA: Diagnosis not present

## 2019-10-03 DIAGNOSIS — Z888 Allergy status to other drugs, medicaments and biological substances status: Secondary | ICD-10-CM

## 2019-10-03 DIAGNOSIS — E119 Type 2 diabetes mellitus without complications: Secondary | ICD-10-CM | POA: Diagnosis not present

## 2019-10-03 DIAGNOSIS — E785 Hyperlipidemia, unspecified: Secondary | ICD-10-CM

## 2019-10-03 DIAGNOSIS — I4891 Unspecified atrial fibrillation: Secondary | ICD-10-CM

## 2019-10-03 DIAGNOSIS — Z79899 Other long term (current) drug therapy: Secondary | ICD-10-CM

## 2019-10-03 DIAGNOSIS — G459 Transient cerebral ischemic attack, unspecified: Secondary | ICD-10-CM

## 2019-10-03 DIAGNOSIS — R55 Syncope and collapse: Secondary | ICD-10-CM | POA: Diagnosis not present

## 2019-10-03 DIAGNOSIS — I10 Essential (primary) hypertension: Secondary | ICD-10-CM | POA: Diagnosis not present

## 2019-10-03 DIAGNOSIS — Z7984 Long term (current) use of oral hypoglycemic drugs: Secondary | ICD-10-CM

## 2019-10-03 DIAGNOSIS — Z8673 Personal history of transient ischemic attack (TIA), and cerebral infarction without residual deficits: Secondary | ICD-10-CM

## 2019-10-03 LAB — LIPID PANEL
Cholesterol: 108 mg/dL (ref 0–200)
HDL: 29 mg/dL — ABNORMAL LOW (ref >40)
LDL Cholesterol: 36 mg/dL (ref 0–99)
Total CHOL/HDL Ratio: 3.7 RATIO
Triglycerides: 215 mg/dL — ABNORMAL HIGH (ref <150)
VLDL: 43 mg/dL — ABNORMAL HIGH (ref 0–40)

## 2019-10-03 LAB — BASIC METABOLIC PANEL
Anion gap: 10 (ref 5–15)
BUN: 20 mg/dL (ref 8–23)
CO2: 26 mmol/L (ref 22–32)
Calcium: 9.4 mg/dL (ref 8.9–10.3)
Chloride: 104 mmol/L (ref 98–111)
Creatinine, Ser: 1.3 mg/dL — ABNORMAL HIGH (ref 0.61–1.24)
GFR calc Af Amer: 58 mL/min — ABNORMAL LOW (ref >60)
GFR calc non Af Amer: 50 mL/min — ABNORMAL LOW (ref >60)
Glucose, Bld: 128 mg/dL — ABNORMAL HIGH (ref 70–99)
Potassium: 3.7 mmol/L (ref 3.5–5.1)
Sodium: 140 mmol/L (ref 135–145)

## 2019-10-03 LAB — ECHOCARDIOGRAM COMPLETE: Weight: 2825.42 oz

## 2019-10-03 LAB — HEMOGLOBIN A1C
Hgb A1c MFr Bld: 5.8 % — ABNORMAL HIGH (ref 4.8–5.6)
Mean Plasma Glucose: 119.76 mg/dL

## 2019-10-03 LAB — GLUCOSE, CAPILLARY
Glucose-Capillary: 134 mg/dL — ABNORMAL HIGH (ref 70–99)
Glucose-Capillary: 127 mg/dL — ABNORMAL HIGH (ref 70–99)

## 2019-10-03 MED ORDER — APIXABAN 5 MG PO TABS: 5.0000 mg | ORAL_TABLET | Freq: Two times a day (BID) | ORAL | Status: DC

## 2019-10-03 MED ORDER — ATORVASTATIN CALCIUM 10 MG PO TABS: 20.0000 mg | ORAL_TABLET | Freq: Every day | ORAL | Status: DC

## 2019-10-03 MED ORDER — APIXABAN 5 MG PO TABS: 5.0000 mg | ORAL_TABLET | Freq: Two times a day (BID) | ORAL | 0 refills | Status: AC

## 2019-10-03 MED ORDER — ATORVASTATIN CALCIUM 80 MG PO TABS: 80.0000 mg | ORAL_TABLET | Freq: Every day | ORAL | 0 refills | Status: AC

## 2019-10-03 NOTE — Progress Notes (Signed)
STROKE TEAM PROGRESS NOTE   SUBJECTIVE (INTERVAL HISTORY) His son is at the bedside.  Overall his condition is completely resolved. Pt stated that he was with his sister at home and he bend over and did not feel well and wanted to go back to his room and then he passed out and falling backward. Before his fall, he felt dizzy lightheaded and nausea and then he could not remember anything. He denies CP or heart palpitation at that time. He takes 3 BP meds at home. And he checks his BP in the morning before taking BP meds.    OBJECTIVE Temp:  [98 F (36.7 C)-98.8 F (37.1 C)] 98 F (36.7 C) (12/29 1105) Pulse Rate:  [59-75] 75 (12/29 1105) Cardiac Rhythm: Atrial fibrillation (12/29 0701) Resp:  [16-18] 18 (12/29 1105) BP: (118-162)/(66-90) 162/81 (12/29 1105) SpO2:  [97 %-98 %] 98 % (12/29 1105)  Recent Labs  Lab 10/02/19 1357 10/02/19 2243 10/03/19 0617 10/03/19 1104  GLUCAP 161* 143* 134* 127*   Recent Labs  Lab 10/02/19 1101 10/02/19 1116 10/03/19 0606  NA 137 138 140  K 4.1 4.0 3.7  CL 100 98 104  CO2 27  --  26  GLUCOSE 261* 256* 128*  BUN 24* 26* 20  CREATININE 1.36* 1.20 1.30*  CALCIUM 9.3  --  9.4   Recent Labs  Lab 10/02/19 1101  AST 25  ALT 29  ALKPHOS 76  BILITOT 1.1  PROT 6.4*  ALBUMIN 3.8   Recent Labs  Lab 10/02/19 1101 10/02/19 1116  WBC 8.1  --   NEUTROABS 4.8  --   HGB 15.5 16.0  HCT 45.8 47.0  MCV 92.5  --   PLT 279  --    No results for input(s): CKTOTAL, CKMB, CKMBINDEX, TROPONINI in the last 168 hours. Recent Labs    10/02/19 1101  LABPROT 14.0  INR 1.1   No results for input(s): COLORURINE, LABSPEC, PHURINE, GLUCOSEU, HGBUR, BILIRUBINUR, KETONESUR, PROTEINUR, UROBILINOGEN, NITRITE, LEUKOCYTESUR in the last 72 hours.  Invalid input(s): APPERANCEUR     Component Value Date/Time   CHOL 108 10/03/2019 0606   TRIG 215 (H) 10/03/2019 0606   HDL 29 (L) 10/03/2019 0606   CHOLHDL 3.7 10/03/2019 0606   VLDL 43 (H) 10/03/2019 0606    LDLCALC 36 10/03/2019 0606   Lab Results  Component Value Date   HGBA1C 5.8 (H) 10/03/2019   No results found for: LABOPIA, COCAINSCRNUR, LABBENZ, AMPHETMU, THCU, LABBARB  No results for input(s): ETH in the last 168 hours.  I have personally reviewed the radiological images below and agree with the radiology interpretations.  EEG  Result Date: 10/02/2019 Lora Havens, MD     10/02/2019  8:35 PM Patient Name: Sher Hellinger MRN: [id number] Epilepsy Attending: Lora Havens Referring Physician/Provider: Dr Modena Nunnery Date: 10/02/2019 Duration: 22.51 mins Patient history: 85yo M with syncopal episode. EEG to evaluate for seizure. Level of alertness: awake, drowsy AEDs during EEG study: None Technical aspects: This EEG study was done with scalp electrodes positioned according to the 10-20 International system of electrode placement. Electrical activity was acquired at a sampling rate of 500Hz  and reviewed with a high frequency filter of 70Hz  and a low frequency filter of 1Hz . EEG data were recorded continuously and digitally stored. DESCRIPTION: The posterior dominant rhythm consists of 8 Hz activity of moderate voltage (25-35 uV) seen predominantly in posterior head regions, symmetric and reactive to eye opening and eye closing. Drowsiness was characterized by attenuation of  the posterior background rhythm. Hyperventilation and photic stimulation were not performed. IMPRESSION: This study is within normal limits. No seizures or epileptiform discharges were seen throughout the recording. Charlsie Quest   CT Code Stroke CTA Head W/WO contrast  Result Date: 10/02/2019 CLINICAL DATA:  Stroke, follow-up. EXAM: CT ANGIOGRAPHY HEAD AND NECK TECHNIQUE: Multidetector CT imaging of the head and neck was performed using the standard protocol during bolus administration of intravenous contrast. Multiplanar CT image reconstructions and MIPs were obtained to evaluate the vascular anatomy. Carotid  stenosis measurements (when applicable) are obtained utilizing NASCET criteria, using the distal internal carotid diameter as the denominator. CONTRAST:  75mL OMNIPAQUE IOHEXOL 350 MG/ML SOLN COMPARISON:  Noncontrast head CT performed earlier the same day 10/02/2019 FINDINGS: CTA NECK FINDINGS Aortic arch: Standard aortic branching. Scattered soft and calcified plaque within the visualized aortic arch and proximal major branch vessels of the neck. Right carotid system: CCA patent without stenosis. Prominent soft and calcified plaque at the carotid bifurcation and within the proximal ICA. There is resultant 50-60% stenosis at the origin of the ICA. Calcified plaques also present within the distal cervical ICA, although without significant narrowing at this site. Left carotid system: CCA patent without stenosis. Moderate soft and calcified plaque at the carotid bifurcation and within the proximal ICA. Resultant less than 50% stenosis within the proximal ICA. Distal to this, the ICA is patent within the neck without stenosis. Vertebral arteries: The left vertebral artery is slightly dominant. Calcified plaque at the origin of the non dominant right vertebral artery results in at least moderate/severe ostial stenosis. Calcified plaque also present at the origin of the dominant left vertebral artery without significant ostial stenosis. More distally the bilateral vertebral arteries are patent within the neck without significant stenosis. Skeleton: No acute bony abnormality. Cervical spondylosis without high-grade bony spinal canal narrowing. Other neck: No soft tissue neck mass or cervical lymphadenopathy. Upper chest: No consolidation within the imaged lung apices. Review of the MIP images confirms the above findings CTA HEAD FINDINGS Anterior circulation: There is dense calcified plaque throughout the bilateral internal carotid artery siphons. There is moderate/severe narrowing of the paraclinoid segments bilaterally  (greater on the left). The M1 right middle cerebral artery is patent without significant stenosis. Atherosclerotic irregularity of right M2 branches. Most notably, there is a moderate focal stenosis within a proximal to mid M2 right MCA branch (series 12, image 13). The right anterior cerebral artery is patent without significant proximal stenosis. The M1 left middle cerebral artery is patent without significant stenosis. Atherosclerotic irregularity of M2 left MCA branches. This includes mild-to-moderate stenosis at the origin of a left M2 MCA branch vessel (series 11, image 20). The left anterior cerebral artery is patent without significant proximal stenosis. No intracranial aneurysm is identified. Posterior circulation: Mixed plaque throughout the intracranial vertebral arteries bilaterally. Sites of up to moderate stenosis within the dominant intracranial left vertebral artery. Sites of up to moderate/severe stenosis within the intracranial right vertebral artery. The basilar artery is patent without significant stenosis. The right posterior cerebral artery is patent. Moderate atherosclerotic irregularity throughout the majority of the P2 right posterior cerebral artery. The distal P1 left posterior cerebral artery is poorly delineated as are much of the P2 and P3 left PCA segments. Findings consistent with high-grade stenosis and/or partial occlusion. There is some flow seen within the distal left posterior cerebral artery. Faint enhancement within a small left posterior communicating artery is visualized. No right posterior communicating artery is seen. Venous  sinuses: Within limitations of contrast timing, no convincing thrombus. Anatomic variants: As described Review of the MIP images confirms the above findings IMPRESSION: CTA neck: 1. The common carotid, internal carotid and vertebral arteries are patent within the neck bilaterally. 2. Mixed plaque results in 50-60% stenosis within the proximal right ICA,  and less than 50% stenosis of the proximal left ICA. 3. Calcified plaque results in moderate/severe ostial stenosis at the origin of the non-dominant right vertebral artery. CTA head: 1. Intracranial atherosclerotic disease, most notably as follows. 2. The distal P1 left posterior cerebral artery and much of the P2 and P3 left PCA segments are poorly delineated. Findings consistent with high-grade stenosis and/or partial occlusion. Some enhancement is seen within the distal left PCA. 3. Moderate atherosclerotic irregularity of the P2 right PCA. 4. Mixed plaque within the intracranial vertebral arteries with sites of up to moderate/severe stenosis on the right, and up to moderate stenosis on the left. 5. Dense calcified plaque results in moderate/severe stenoses within the paraclinoid internal carotid arteries (greater on the left). 6. Moderate focal stenosis within a mid to distal M2 right MCA branch vessel. 7. Mild/moderate focal stenosis at the origin of an M2 left MCA branch vessel. Electronically Signed   By: Jackey Loge DO   On: 10/02/2019 12:08   CT Code Stroke CTA Neck W/WO contrast  Result Date: 10/02/2019 CLINICAL DATA:  Stroke, follow-up. EXAM: CT ANGIOGRAPHY HEAD AND NECK TECHNIQUE: Multidetector CT imaging of the head and neck was performed using the standard protocol during bolus administration of intravenous contrast. Multiplanar CT image reconstructions and MIPs were obtained to evaluate the vascular anatomy. Carotid stenosis measurements (when applicable) are obtained utilizing NASCET criteria, using the distal internal carotid diameter as the denominator. CONTRAST:  84mL OMNIPAQUE IOHEXOL 350 MG/ML SOLN COMPARISON:  Noncontrast head CT performed earlier the same day 10/02/2019 FINDINGS: CTA NECK FINDINGS Aortic arch: Standard aortic branching. Scattered soft and calcified plaque within the visualized aortic arch and proximal major branch vessels of the neck. Right carotid system: CCA patent  without stenosis. Prominent soft and calcified plaque at the carotid bifurcation and within the proximal ICA. There is resultant 50-60% stenosis at the origin of the ICA. Calcified plaques also present within the distal cervical ICA, although without significant narrowing at this site. Left carotid system: CCA patent without stenosis. Moderate soft and calcified plaque at the carotid bifurcation and within the proximal ICA. Resultant less than 50% stenosis within the proximal ICA. Distal to this, the ICA is patent within the neck without stenosis. Vertebral arteries: The left vertebral artery is slightly dominant. Calcified plaque at the origin of the non dominant right vertebral artery results in at least moderate/severe ostial stenosis. Calcified plaque also present at the origin of the dominant left vertebral artery without significant ostial stenosis. More distally the bilateral vertebral arteries are patent within the neck without significant stenosis. Skeleton: No acute bony abnormality. Cervical spondylosis without high-grade bony spinal canal narrowing. Other neck: No soft tissue neck mass or cervical lymphadenopathy. Upper chest: No consolidation within the imaged lung apices. Review of the MIP images confirms the above findings CTA HEAD FINDINGS Anterior circulation: There is dense calcified plaque throughout the bilateral internal carotid artery siphons. There is moderate/severe narrowing of the paraclinoid segments bilaterally (greater on the left). The M1 right middle cerebral artery is patent without significant stenosis. Atherosclerotic irregularity of right M2 branches. Most notably, there is a moderate focal stenosis within a proximal to mid M2 right MCA  branch (series 12, image 13). The right anterior cerebral artery is patent without significant proximal stenosis. The M1 left middle cerebral artery is patent without significant stenosis. Atherosclerotic irregularity of M2 left MCA branches. This  includes mild-to-moderate stenosis at the origin of a left M2 MCA branch vessel (series 11, image 20). The left anterior cerebral artery is patent without significant proximal stenosis. No intracranial aneurysm is identified. Posterior circulation: Mixed plaque throughout the intracranial vertebral arteries bilaterally. Sites of up to moderate stenosis within the dominant intracranial left vertebral artery. Sites of up to moderate/severe stenosis within the intracranial right vertebral artery. The basilar artery is patent without significant stenosis. The right posterior cerebral artery is patent. Moderate atherosclerotic irregularity throughout the majority of the P2 right posterior cerebral artery. The distal P1 left posterior cerebral artery is poorly delineated as are much of the P2 and P3 left PCA segments. Findings consistent with high-grade stenosis and/or partial occlusion. There is some flow seen within the distal left posterior cerebral artery. Faint enhancement within a small left posterior communicating artery is visualized. No right posterior communicating artery is seen. Venous sinuses: Within limitations of contrast timing, no convincing thrombus. Anatomic variants: As described Review of the MIP images confirms the above findings IMPRESSION: CTA neck: 1. The common carotid, internal carotid and vertebral arteries are patent within the neck bilaterally. 2. Mixed plaque results in 50-60% stenosis within the proximal right ICA, and less than 50% stenosis of the proximal left ICA. 3. Calcified plaque results in moderate/severe ostial stenosis at the origin of the non-dominant right vertebral artery. CTA head: 1. Intracranial atherosclerotic disease, most notably as follows. 2. The distal P1 left posterior cerebral artery and much of the P2 and P3 left PCA segments are poorly delineated. Findings consistent with high-grade stenosis and/or partial occlusion. Some enhancement is seen within the distal left  PCA. 3. Moderate atherosclerotic irregularity of the P2 right PCA. 4. Mixed plaque within the intracranial vertebral arteries with sites of up to moderate/severe stenosis on the right, and up to moderate stenosis on the left. 5. Dense calcified plaque results in moderate/severe stenoses within the paraclinoid internal carotid arteries (greater on the left). 6. Moderate focal stenosis within a mid to distal M2 right MCA branch vessel. 7. Mild/moderate focal stenosis at the origin of an M2 left MCA branch vessel. Electronically Signed   By: Jackey Loge DO   On: 10/02/2019 12:08   CT Lumbar Spine Wo Contrast  Result Date: 10/02/2019 CLINICAL DATA:  Low back pain after syncopal episode resulting in a fall today. Initial encounter. EXAM: CT LUMBAR SPINE WITHOUT CONTRAST TECHNIQUE: Multidetector CT imaging of the lumbar spine was performed without intravenous contrast administration. Multiplanar CT image reconstructions were also generated. COMPARISON:  None. FINDINGS: Segmentation: Standard. Alignment: Normal. Vertebrae: No acute fracture or focal pathologic process. The patient is status post L4-5 laminectomy and fusion with unilateral pedicle screws and stabilization bars in place on the left. No evidence of loosening are other complication. Paraspinal and other soft tissues: Atherosclerosis is noted. Otherwise negative. Disc levels: T9-10: Negative. T10-11: Negative. T11-12: Negative. T12-L1: Negative. L1-2: Shallow disc bulge without stenosis. L2-3: Shallow disc bulge and vacuum disc phenomenon. Mild endplate spur. The central canal is open. Mild to moderate foraminal narrowing is more notable on the left. L3-4: Shallow disc bulge and ligamentum flavum thickening. The central canal is open. Moderate bilateral foraminal narrowing. L4-5: Status post laminectomy and fusion.  No stenosis. L5-S1: Shallow disc bulge and mild facet degenerative  change. No stenosis. IMPRESSION: Negative for fracture or other acute  abnormality. The central canal appears open at all levels. Scattered foraminal narrowing is most notable at L3-4 where it appears moderate bilaterally. Electronically Signed   By: Drusilla Kanner M.D.   On: 10/02/2019 11:47   MR BRAIN WO CONTRAST  Result Date: 10/02/2019 CLINICAL DATA:  TIA.  Syncope. EXAM: MRI HEAD WITHOUT CONTRAST TECHNIQUE: Multiplanar, multiecho pulse sequences of the brain and surrounding structures were obtained without intravenous contrast. COMPARISON:  CT a head and neck 10/02/2019 FINDINGS: Brain: Negative for acute infarct. Mild white matter changes. Brainstem and cerebellum normal. Negative for hemorrhage or mass. Mild atrophy, age-appropriate. Vascular: Normal arterial flow voids. Skull and upper cervical spine: No focal skeletal lesion. Sinuses/Orbits: Moderate mucosal edema paranasal sinuses. Mastoid clear. Normal orbit Other: None IMPRESSION: Negative for acute infarct. Mild atrophy and mild chronic microvascular ischemic change in the white matter. Electronically Signed   By: Marlan Palau M.D.   On: 10/02/2019 15:43   ECHOCARDIOGRAM COMPLETE  Result Date: 10/03/2019   ECHOCARDIOGRAM REPORT   Patient Name:   URIEL DOWDING Date of Exam: 10/03/2019 Medical Rec #:  [id number]    Height: Accession #:    [id number]   Weight:       176.6 lb Date of Birth:  Aug 06, 1934    BSA:          1.86 m Patient Age:    85 years     BP:           128/79 mmHg Patient Gender: M            HR:           68 bpm. Exam Location:  Inpatient Procedure: 2D Echo Indications:    Atrial Fibrillation 427.31 / I48.91  History:        Patient has no prior history of Echocardiogram examinations.                 Stroke; Risk Factors:Diabetes, Hypertension and Dyslipidemia.                 Syncope.  Sonographer:    Leta Jungling RDCS Referring Phys: [id number] NATHAN PICKERING IMPRESSIONS  1. Left ventricular ejection fraction, by visual estimation, is 50 to 55%. The left ventricle has normal function. Left  ventricular septal wall thickness was mildly increased. Normal left ventricular posterior wall thickness. There is mildly increased left ventricular hypertrophy.  2. Left ventricular diastolic parameters are indeterminate.  3. The left ventricle demonstrates global hypokinesis.  4. Global right ventricle has normal systolic function.The right ventricular size is normal. No increase in right ventricular wall thickness.  5. Left atrial size was normal.  6. Right atrial size was normal.  7. The mitral valve is normal in structure. No evidence of mitral valve regurgitation. No evidence of mitral stenosis.  8. The tricuspid valve is normal in structure.  9. The aortic valve is tricuspid. Aortic valve regurgitation is not visualized. Mild to moderate aortic valve sclerosis/calcification without any evidence of aortic stenosis. 10. The pulmonic valve was normal in structure. Pulmonic valve regurgitation is not visualized. 11. The inferior vena cava is normal in size with greater than 50% respiratory variability, suggesting right atrial pressure of 3 mmHg. FINDINGS  Left Ventricle: Left ventricular ejection fraction, by visual estimation, is 50 to 55%. The left ventricle has normal function. The left ventricle demonstrates global hypokinesis. Normal left ventricular posterior wall thickness. There is mildly increased left ventricular hypertrophy.  Asymmetric left ventricular hypertrophy. Left ventricular diastolic parameters are indeterminate. Normal left atrial pressure. Right Ventricle: The right ventricular size is normal. No increase in right ventricular wall thickness. Global RV systolic function is has normal systolic function. Left Atrium: Left atrial size was normal in size. Right Atrium: Right atrial size was normal in size Pericardium: There is no evidence of pericardial effusion. Mitral Valve: The mitral valve is normal in structure. No evidence of mitral valve regurgitation. No evidence of mitral valve stenosis  by observation. Tricuspid Valve: The tricuspid valve is normal in structure. Tricuspid valve regurgitation is trivial. Aortic Valve: The aortic valve is tricuspid. . There is mild thickening and mild calcification of the aortic valve. Aortic valve regurgitation is not visualized. Mild to moderate aortic valve sclerosis/calcification is present, without any evidence of aortic stenosis. There is mild thickening of the aortic valve. There is mild calcification of the aortic valve. Pulmonic Valve: The pulmonic valve was normal in structure. Pulmonic valve regurgitation is not visualized. Pulmonic regurgitation is not visualized. Aorta: The aortic root, ascending aorta and aortic arch are all structurally normal, with no evidence of dilitation or obstruction. Venous: The inferior vena cava is normal in size with greater than 50% respiratory variability, suggesting right atrial pressure of 3 mmHg. IAS/Shunts: No atrial level shunt detected by color flow Doppler. There is no evidence of a patent foramen ovale. No ventricular septal defect is seen or detected. There is no evidence of an atrial septal defect.  LEFT VENTRICLE PLAX 2D LVIDd:         3.86 cm  Diastology LVIDs:         3.01 cm  LV e' lateral:   7.84 cm/s LV PW:         0.75 cm  LV E/e' lateral: 16.0 LV IVS:        1.29 cm  LV e' medial:    6.69 cm/s LVOT diam:     2.00 cm  LV E/e' medial:  18.8 LV SV:         29 ml LVOT Area:     3.14 cm  RIGHT VENTRICLE RV S prime:     9.36 cm/s TAPSE (M-mode): 1.3 cm LEFT ATRIUM             Index LA diam:        4.90 cm 2.64 cm/m LA Vol (A2C):   67.4 ml 36.27 ml/m LA Vol (A4C):   68.1 ml 36.64 ml/m LA Biplane Vol: 72.3 ml 38.90 ml/m  AORTIC VALVE LVOT Vmax:   67.30 cm/s LVOT Vmean:  41.500 cm/s LVOT VTI:    0.117 m  AORTA Ao Root diam: 2.50 cm Ao Asc diam:  2.80 cm MITRAL VALVE                        TRICUSPID VALVE MV Area (PHT): 2.88 cm             TR Peak grad:   13.0 mmHg MV PHT:        76.46 msec           TR Vmax:         180.00 cm/s MV Decel Time: 264 msec MV E velocity: 125.67 cm/s 103 cm/s SHUNTS                                     Systemic  VTI:  0.12 m                                     Systemic Diam: 2.00 cm  Chilton Si MD Electronically signed by Chilton Si MD Signature Date/Time: 10/03/2019/11:42:28 AM    Final    CT HEAD CODE STROKE WO CONTRAST  Result Date: 10/02/2019 CLINICAL DATA:  Code stroke. Focal neuro deficit, greater than 6 hours, stroke suspected. Additional history provided: Last known normal 10:30 a.m., left leg weakness. EXAM: CT HEAD WITHOUT CONTRAST TECHNIQUE: Contiguous axial images were obtained from the base of the skull through the vertex without intravenous contrast. COMPARISON:  No pertinent prior studies available for comparison. FINDINGS: Brain: No evidence of acute intracranial hemorrhage. No demarcated cortical infarction. No evidence of intracranial mass. No midline shift or extra-axial fluid collection. Mild generalized parenchymal atrophy. Vascular: No definite hyperdense vessel. Atherosclerotic calcifications. Skull: Normal. Negative for fracture or focal lesion. Sinuses/Orbits: Visualized orbits demonstrate no acute abnormality. Mild scattered paranasal sinus mucosal thickening. No significant mastoid effusion. ASPECTS ([place] Stroke Program Early CT Score) - Ganglionic level infarction (caudate, lentiform nuclei, internal capsule, insula, M1-M3 cortex): 7 - Supraganglionic infarction (M4-M6 cortex): 3 Total score (0-10 with 10 being normal): 10 These results were called by telephone at the time of interpretation on 10/02/2019 at 11:25 am to provider Dr. Wilford Corner, who verbally acknowledged these results. IMPRESSION: No evidence of acute intracranial abnormality. Mild generalized parenchymal atrophy. Electronically Signed   By: Jackey Loge DO   On: 10/02/2019 11:27    PHYSICAL EXAM  Temp:  [98 F (36.7 C)-98.8 F (37.1 C)] 98 F (36.7 C) (12/29 1105) Pulse Rate:   [59-75] 75 (12/29 1105) Resp:  [16-18] 18 (12/29 1105) BP: (118-162)/(66-90) 162/81 (12/29 1105) SpO2:  [97 %-98 %] 98 % (12/29 1105)  General - Well nourished, well developed, in no apparent distress.  Ophthalmologic - fundi not visualized due to noncooperation.  Cardiovascular - Regular rhythm and rate.  Mental Status -  Level of arousal and orientation to time, place, and person were intact. Language including expression, naming, repetition, comprehension was assessed and found intact. Fund of Knowledge was assessed and was intact.  Cranial Nerves II - XII - II - Visual field intact OU. III, IV, VI - Extraocular movements intact. V - Facial sensation intact bilaterally. VII - Facial movement intact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

## 2019-10-03 NOTE — Progress Notes (Signed)
  Date: 10/03/2019  Patient name: Sean Barrett  Medical record number: [id number]  Date of birth: [birth year]   I have seen and evaluated Sean Barrett and discussed their care with the Residency Team. Briefly, [name] is an 85 year old man with PMH of DM2, HTN, HLD, prior CVA who presented for dizziness and syncope.  There was concern for stroke and stroke work up has been completed.  MRI showed no acute infarct and CTA of the head neck showed significant changes to vascular segments of the anterior and posterior circulation.  He was also found to be in Afib with a controlled rate.  Predominant theory for syncope is that his new onset Afib caused a decreased perfusion of the brain given the severe stenosis in several sites.     Vitals:   10/03/19 0613 10/03/19 1105  BP: 128/79 (!) 162/81  Pulse: 68 75  Resp: 16 18  Temp: 98.4 F (36.9 C) 98 F (36.7 C)  SpO2: 98% 98%   General: Awake, Alert, no acute distress Eyes: EOMI, Nystagmus on leftward gaze, slow, anicteric CV: Irreg Irreg, normal rate, no murmur, pulses intact pedal and radial Pulm: Breathing comfortably, no wheezing Abd: Soft, ND MSK: Muscle tone is normal for his age, no contractures Neuro: Alert, oriented X 3, moving all extremities spontaneously.  Reports dizziness, not recurrent with eye or head movements.   Assessment and Plan: I have seen and evaluated the patient as outlined above. I agree with the formulated Assessment and Plan as detailed in the residents' note, with the following changes:   1. Syncope - Likely vertebrobasilar insufficiency in the setting of new afib - Aspirin started - Permissive HTN - Continue to hold HCTZ on discharge  2. Afib - unclear time of onset - Already on Carvedilol which will help with rate control, rates in the 70s - Start Eliquis on discharge  Other issues per Dr. Ronnald Ramp' note.  If patient does well with PT/OT may be able to be discharged today.    Sid Falcon,  MD 12/29/20201:42 PM

## 2019-10-03 NOTE — Evaluation (Signed)
Occupational Therapy Evaluation Patient Details Name: Sean RiggerJesse Barrett MRN: [id number] DOB: 03/19/1934 Today's Date: 10/03/2019    History of Present Illness Pt is a 85 year old M with significant PMH of diabetes, HTN, HLD, and a prior CVA who presented after a fall this morning. Admitted for L LE weakness and syncope. CT negative for acute abnormalities, CTA head and neck reveals "calcifications at carotid bifurcations.  Most predominantly posterior circulation-left vertebral artery with severe calcification and left P1 with severe stenosis/occlusion". EEG negative. MRI negative.    Clinical Impression   PTA patient independent and driving. Admitted for above and limited by dizziness/nausea, impaired balance.  [place] from [place], and plans to return home when able (son is driving).  Currently requires min guard for transfers and in room mobility, min guard for LB ADLs and supervision for UB ADLs.  Mild unsteadiness in standing and appears fearful of falling, 1 LOB noted in standing with min guard to correct with pt reaching for RW.  Patient educated on fall prevention and safety, increased time in positional changes due to dizziness; but inconsistent reports of dizziness throughout session.  Patient reports he can have support 24/7 as needed, and agreeable to recommendations of 24/7 support initially. Anticipate no further OT needs after dc.  Will follow acutely to optimize independence and safety with ADLs and mobility.     Follow Up Recommendations  No OT follow up;Supervision/Assistance - 24 hour(inital)    Equipment Recommendations  None recommended by OT    Recommendations for Other Services       Precautions / Restrictions Precautions Precautions: Fall Restrictions Weight Bearing Restrictions: No      Mobility Bed Mobility Overal bed mobility: Modified Independent                Transfers Overall transfer level: Needs assistance   Transfers:  Sit to/from Stand Sit to Stand: Min guard         General transfer comment: min guard for safety/balance, RW present for safety due to dizziness but pt did not use it; 1 LOB with dynamic standing activities but recovered with min guard     Balance Overall balance assessment: Needs assistance Sitting-balance support: No upper extremity supported;Feet supported Sitting balance-Leahy Scale: Good     Standing balance support: Bilateral upper extremity supported;No upper extremity supported;During functional activity Standing balance-Leahy Scale: Fair Standing balance comment: preference to external UE support                           ADL either performed or assessed with clinical judgement   ADL Overall ADL's : Needs assistance/impaired     Grooming: Supervision/safety;Standing   Upper Body Bathing: Supervision/ safety;Set up;Sitting   Lower Body Bathing: Set up;Supervison/ safety;Sitting/lateral leans   Upper Body Dressing : Set up;Sitting   Lower Body Dressing: Min guard;Sit to/from stand Lower Body Dressing Details (indicate cue type and reason): donning slippers with supervision, min guard sit to stand  Toilet Transfer: Min guard;Ambulation;RW(or no AD ) Toilet Transfer Details (indicate cue type and reason): min guard for safety/balance, simulated in room         Functional mobility during ADLs: Min guard(RW vs no AD ) General ADL Comments: pt limited by dizziness, nausea and impaired balance      Vision   Vision Assessment?: No apparent visual deficits     Perception     Praxis      Pertinent Vitals/Pain  Pain Assessment: Faces Faces Pain Scale: Hurts a little bit Pain Location: headache Pain Descriptors / Indicators: Headache Pain Intervention(s): Monitored during session;Repositioned;Limited activity within patient's tolerance     Hand Dominance Right   Extremity/Trunk Assessment Upper Extremity Assessment Upper Extremity Assessment:  Overall WFL for tasks assessed   Lower Extremity Assessment Lower Extremity Assessment: Defer to PT evaluation       Communication Communication Communication: No difficulties   Cognition Arousal/Alertness: Awake/alert Behavior During Therapy: WFL for tasks assessed/performed Overall Cognitive Status: Within Functional Limits for tasks assessed                                 General Comments: slightly anxious and fearful of falling, Short blessed test completed and reveals normal cognition (4/28) with some STM deficits but anticipate this is baseline    General Comments  educated on positional changes with increased time due to dizziness, completing ADLs seated for safety, and discussed fall prevention; pt functioning well, but recommended 24/7 support initally and pt agreeable     Exercises     Shoulder Instructions      Home Living Family/patient expects to be discharged to:: Private residence Living Arrangements: Alone Available Help at Discharge: Family;Friend(s);Available PRN/intermittently Type of Home: Other(Comment)(townhouse ) Home Access: Level entry     Home Layout: Two level Alternate Level Stairs-Number of Steps: flight  Alternate Level Stairs-Rails: (+ rail) Bathroom Shower/Tub: Teacher, early years/pre: Standard     Home Equipment: Environmental consultant - 4 wheels;Shower seat   Additional Comments: is from [place], is visiting sisters home with his son; plans to go back to [place] with son after dc       Prior Functioning/Environment Level of Independence: Independent                 OT Problem List: Decreased activity tolerance;Impaired balance (sitting and/or standing);Decreased safety awareness;Decreased knowledge of use of DME or AE;Decreased knowledge of precautions      OT Treatment/Interventions: Self-care/ADL training;DME and/or AE instruction;Therapeutic exercise;Balance training;Therapeutic activities    OT Goals(Current  goals can be found in the care plan section) Acute Rehab OT Goals Patient Stated Goal: to get home  OT Goal Formulation: With patient Time For Goal Achievement: 10/17/19 Potential to Achieve Goals: Good  OT Frequency: Min 2X/week   Barriers to D/C:            Co-evaluation              AM-PAC OT "6 Clicks" Daily Activity     Outcome Measure Help from another person eating meals?: None Help from another person taking care of personal grooming?: A Little Help from another person toileting, which includes using toliet, bedpan, or urinal?: A Little Help from another person bathing (including washing, rinsing, drying)?: A Little Help from another person to put on and taking off regular upper body clothing?: A Little Help from another person to put on and taking off regular lower body clothing?: A Little 6 Click Score: 19   End of Session Equipment Utilized During Treatment: Gait belt;Rolling walker Nurse Communication: Mobility status  Activity Tolerance: Patient tolerated treatment well Patient left: with bed alarm set;with call bell/phone within reach;Other (comment)(seated EOB)  OT Visit Diagnosis: Unsteadiness on feet (R26.81);Dizziness and giddiness (R42)                Time: 9242-6834 OT Time Calculation (min): 16 min Charges:  OT  General Charges $OT Visit: 1 Visit OT Evaluation $OT Eval Moderate Complexity: 1 Mod  Barry Brunner, OT Acute Rehabilitation Services Pager [phone] Office [phone]    Chancy Milroy 10/03/2019, 12:06 PM

## 2019-10-03 NOTE — Progress Notes (Signed)
  Echocardiogram 2D Echocardiogram has been performed.  Sean Barrett M 10/03/2019, 7:52 AM

## 2019-10-03 NOTE — Discharge Summary (Signed)
Name: Sean Barrett MRN: [id number] DOB: April 26, 1934 85 y.o. PCP: System, Pcp Not In  Date of Admission: 10/02/2019 10:32 AM Date of Discharge: 10/03/2019 Attending Physician: Sid Falcon, MD  Discharge Diagnosis: 1. Syncope secondary to vertebrobasilar insufficiency  2. New-onset afib 3. HTN   Discharge Medications: Allergies as of 10/03/2019      Reactions   Tenex [guanfacine] Other (See Comments)   Unknown reaction      Medication List    STOP taking these medications   amLODipine 10 MG tablet Commonly known as: NORVASC   clopidogrel 75 MG tablet Commonly known as: PLAVIX   hydrochlorothiazide 12.5 MG tablet Commonly known as: HYDRODIURIL     TAKE these medications   apixaban 5 MG Tabs tablet Commonly known as: ELIQUIS Take 1 tablet (5 mg total) by mouth 2 (two) times daily.   atorvastatin 80 MG tablet Commonly known as: LIPITOR Take 1 tablet (80 mg total) by mouth daily. What changed:   medication strength  how much to take   carvedilol 12.5 MG tablet Commonly known as: COREG Take 12.5 mg by mouth 2 (two) times daily with a meal.   meclizine 12.5 MG tablet Commonly known as: ANTIVERT Take 1 tablet (12.5 mg total) by mouth 3 (three) times daily as needed for dizziness.   metFORMIN 1000 MG tablet Commonly known as: GLUCOPHAGE Take 500-1,000 mg by mouth See admin instructions. Take 1/2 tablet (500 mg) by mouth every morning and 1 tablet (1000 mg) at night   multivitamin with minerals Tabs tablet Take 1 tablet by mouth daily.            Durable Medical Equipment  (From admission, onward)         Start     Ordered   10/03/19 0000  For home use only DME 4 wheeled rolling walker with seat    Question:  Patient needs a walker to treat with the following condition  Answer:  Gait instability   10/03/19 1439          Disposition and follow-up:   SeanSean Barrett was discharged from [facility] in Good condition.  At the  hospital follow up visit please address:  1.  Syncope: Ensure he has had no additional episodes. Neurology was consulted and felt event was due to chronic vertebrobasilar insufficiency in the setting of new-onset afib.   2.  Labs / imaging needed at time of follow-up: none  3.  Pending labs/ test needing follow-up: none   Follow-up Appointments:   Hospital Course by problem list: Sean Barrett is a 85 year old M with significant PMH of diabetes, HTN, HLD, and a prior CVA who presented after a syncopal episode, was brought to the ED as a code stroke, and was admitted to the hospital for further evaluation.  1. Syncope: Patient had prodromal symptoms prior to episode. Neuro imaging done which ruled out an acute stroke. EEG was also performed which did not show any evidence of seizure like activity. 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 This, along with new-onset afib, is most likely cause of his syncope. We also discontinued his HCTZ and Amlodipine given his low-normal blood pressures. Blood pressure goal 130-150 in the setting of intracranial vascular stenosis to allow for better perfusion.   2. New-onset afib: Confirmed on repeat EKGs and continuous telemetry monitoring. CHADS2-VASc score of 6, HAS-BLED score of 3. He had been on Plavix for posterior circulation stenosis and was  started on high dose aspirin upon admission to treat possible acute CVA. Since this was ruled out, decision was made to discontinue both aspirin and plavix and initiate Eliquis. Rates remained normal throughout stay. Echo did not show any atrial enlargement or valvular abnormalities.   3. HTN: Continued on Metoprolol. HCTZ and Amlodipine held as mentioned above.     Discharge Vitals:   BP (!) 162/81 (BP Location: Left Arm)   Pulse 75   Temp 98 F (36.7 C) (Oral)   Resp 18   Wt 80.1 kg   SpO2 98%   Pertinent Labs, Studies, and Procedures:  Echo  1. Left  ventricular ejection fraction, by visual estimation, is 50 to 55%. The left ventricle has normal function. Left ventricular septal wall thickness was mildly increased. Normal left ventricular posterior wall thickness. There is mildly increased  left ventricular hypertrophy.  2. Left ventricular diastolic parameters are indeterminate.  3. The left ventricle demonstrates global hypokinesis.  4. Global right ventricle has normal systolic function.The right ventricular size is normal. No increase in right ventricular wall thickness.  5. Left atrial size was normal.  6. Right atrial size was normal.  7. The mitral valve is normal in structure. No evidence of mitral valve regurgitation. No evidence of mitral stenosis.  8. The tricuspid valve is normal in structure.  9. The aortic valve is tricuspid. Aortic valve regurgitation is not visualized. Mild to moderate aortic valve sclerosis/calcification without any evidence of aortic stenosis. 10. The pulmonic valve was normal in structure. Pulmonic valve regurgitation is not visualized. 11. The inferior vena cava is normal in size with greater than 50% respiratory variability, suggesting right atrial pressure of 3 mmHg.  Discharge Instructions: Discharge Instructions    Call MD for:  difficulty breathing, headache or visual disturbances   Complete by: As directed    Call MD for:  extreme fatigue   Complete by: As directed    Call MD for:  hives   Complete by: As directed    Call MD for:  persistant dizziness or light-headedness   Complete by: As directed    Call MD for:  persistant nausea and vomiting   Complete by: As directed    Call MD for:  redness, tenderness, or signs of infection (pain, swelling, redness, odor or green/yellow discharge around incision site)   Complete by: As directed    Call MD for:  severe uncontrolled pain   Complete by: As directed    Call MD for:  temperature >100.4   Complete by: As directed    Diet - low sodium heart  healthy   Complete by: As directed    Discharge instructions   Complete by: As directed    Sean Barrett, Sean Barrett were seen in the hospital after passing out at home. Imaging of your head and neck did not show any new strokes. Two separate heart tracings of your heart's rhythm did show atrial fibrillation, or an abnormal heart beat. A fib can put you at risk of other strokes, so to prevent this from occurring please begin taking Eliquis twice a day. Also, increase your atorvastatin dose to 80mg  daily.  Additionally, evaluation within the hospital showed that your blood pressure dropped upon standing. To prevent future falls due to this low blood pressure, please stop taking the HCTZ and amlodipine medications. Continue taking the carvedilol 12.5mg  daily.  It will be important to follow-up with your primary care doctor in and re-establish with a heart doctor to  monitor your a fib.  Thank you for letting us be a part of your care!   For home use only DME 4 wheeled rolling walker with seat   Complete by: As directed    Patient needs a walker to treat with the following condition: Gait instability   Increase activity slowly   Complete by: As directed       Signed: Bridget HartshornBloomfield, Cyrena Kuchenbecker D, DO 10/03/2019, 2:47 PM   Pager: [phone]

## 2019-10-03 NOTE — Progress Notes (Addendum)
Subjective: Pt seen at the bedside this morning. States he is feeling well. Endorses some continued nausea and dizziness, though states it is much improved from when he presented yesterday. Denies chest pain, palpitations, or shortness of breath. No acute concerns at this time.  Objective:  Vital signs in last 24 hours: Vitals:   10/02/19 2006 10/02/19 2245 10/03/19 0122 10/03/19 0613  BP: (!) 148/66 118/68 135/72 128/79  Pulse: 74 (!) 59 73 68  Resp: 17 16 16 16   Temp:  98.8 F (37.1 C) 98.5 F (36.9 C) 98.4 F (36.9 C)  TempSrc:  Oral Oral Oral  SpO2: 98% 97% 97% 98%  Weight:       Physical Exam Vitals and nursing note reviewed.  Constitutional:      General: He is not in acute distress.    Appearance: He is not ill-appearing.  Eyes:     Extraocular Movements: Extraocular movements intact.     Comments: No nystagmus noted.  Cardiovascular:     Rate and Rhythm: Normal rate and regular rhythm.     Heart sounds: Normal heart sounds.  Pulmonary:     Effort: Pulmonary effort is normal.     Breath sounds: Normal breath sounds.  Musculoskeletal:     Right lower leg: No edema.     Left lower leg: No edema.  Skin:    General: Skin is warm and dry.  Neurological:     Mental Status: He is alert.     Cranial Nerves: No facial asymmetry.     Comments: Moving all extremities spontaneously    Assessment/Plan:  Active Problems:   Syncope  Sean Barrett is a 85 year old M with significant PMH of diabetes, HTN, HLD, and a prior CVA who presented after a syncopal episode, was brought to the ED as a code stroke, and was admitted to the hospital for further evaluation.  Syncope Pt presents after a syncopal episode with prodromal symptoms of nausea and dizziness, and he was able to brace himself on a kitchen island before loss of consciousness. In the ED, CT head negative, and CTA with posterior circulation stenosis. - MRI of brain negative for acute infarcts - echo significant for  LVEF 50-55%, normal LV function, global hypokinesis of LV, mildly increased LV septal thickness, mild LVH, normal RV function, normal R and L atrial size, and mild to moderate aortic valve sclerosis/calcification without stenosis - routine EEG within normal limits - orthostatic vital signs positive, though within normotensive range - A1c 5.8 and lipid panel with LDL 36, HDL 29, total cholesterol 108, and triglycerides 215. - continue atorvastatin 80mg  daily - pt on home clopidogrel 75mg   Neurology consulted, appreciate recommendations - suspect syncopal episode in the setting of vertebrobasilar insufficiency due to stenosis posterior circulation with new a fib - ASA 325mg  started - permissive HTN for first 24-48hr, with goal of gradual normalization to SBP <140 upon discharge - PT/OT/speech consult  A fib Pt states he has an extensive family history of atrial fibrillation from multiple siblings. EKG in the ED and this morning revealing a fib. Rhythm sounds regular on ausculation. HR remains well controlled in the 60's. - CHADS2-VASc score of 6, due to age and history of CVA, HTN, and diabetes - HAS-BLED score of 3 due to age, stroke history, and clopidogrel use - begin anticoagulation with apixaban at discharge - likely also discontinue ASA and clopidogrel at that time  HTN Pt taking HCTZ 12.5mg , amlodipine 10mg , and carvedilol 12.5mg  daily.  -  holding home anti-hypertensives while inpatient - orthostatic vital signs positive, though remained in the normotensive range - it is possible that pt's dizziness/syncope also exacerbated by overtreatment of BP - will discontinue HCTZ at discharge  Diabetes Pt endorses taking metformin 1000mg  hs + 500mg  ac at home. A1c yesterday 5.8 - moderate + hs SSI  Diet - carb modified Fluids - none DVT ppx - enoxaparin 40mg  subQ daily CODE STATUS - FULL CODE  Dispo: Anticipated discharge today pending neuro clearance. Pt instructed to follow-up  with PCP in for continued monitoring and management.  , MD 10/03/2019, 6:26 AM Pager: [phone]

## 2019-10-03 NOTE — Discharge Instructions (Signed)

## 2019-10-03 NOTE — Progress Notes (Signed)
ANTICOAGULATION CONSULT NOTE - Initial Consult  Pharmacy Consult for Eliquis Indication: atrial fibrillation  Allergies  Allergen Reactions  . Tenex [Guanfacine] Other (See Comments)    Unknown reaction    Patient Measurements: Weight: 176 lb 9.4 oz (80.1 kg)  Vital Signs: Temp: 98 F (36.7 C) (12/29 1105) Temp Source: Oral (12/29 1105) BP: 162/81 (12/29 1105) Pulse Rate: 75 (12/29 1105)  Labs: Recent Labs    10/02/19 1101 10/02/19 1116 10/03/19 0606  HGB 15.5 16.0  --   HCT 45.8 47.0  --   PLT 279  --   --   APTT 32  --   --   LABPROT 14.0  --   --   INR 1.1  --   --   CREATININE 1.36* 1.20 1.30*    CrCl cannot be calculated (Unknown ideal weight.).   Medical History: Past Medical History:  Diagnosis Date  . Diabetes mellitus without complication ([place])   . Hypertension     Assessment: 82 YOF presenting after a fall, new Afib and to start anticoagulation with Eliquis.  Age >80, Wt 80kg, SCr 1.3.  Lovenox 40mg  was administered today at 1330.    Goal of Therapy:  Monitor platelets by anticoagulation protocol: Yes   Plan:  Eliquis 5mg  PO BID, will start later this evening d/t lovenox dose Monitor renal function, s/s bleeding  Bertis Ruddy, PharmD Clinical Pharmacist Please check AMION for all [place] numbers 10/03/2019 2:14 PM

## 2019-10-03 NOTE — Progress Notes (Signed)
SLP Cancellation Note  Patient Details Name: Loranzo Desha MRN: [id number] DOB: 29-Oct-1933   Cancelled treatment:       Reason Eval/Treat Not Completed: SLP screened, no needs identified, will sign off.  Per chart review, pt's symptoms have completely resolved and is awaiting imminent discharge.  As a result, will defer ST cognitive-linguistic evaluation at this time.     Tais Koestner, Selinda Orion 10/03/2019, 3:29 PM

## 2019-10-03 NOTE — Progress Notes (Signed)
RN gave pt and son discharge instructions and the pt stated understanding. Pt started on new medication escribed to pt home pharmacy. Belongings packed and IV removed

## 2019-10-03 NOTE — TOC Initial Note (Signed)
Transition of Care [facility]) - Initial/Assessment Note    Patient Details  Name: Sean Barrett MRN: [id number] Date of Birth: Feb 04, 1934  Transition of Care [facility]) CM/SW Contact:    Marilu Favre, RN Phone Number: 10/03/2019, 1:16 PM  Clinical Narrative:                 Spoke to patient and son at bedside. Here visiting from [facility], planning to return to [facility] this week.   Expected Discharge Plan: Home/Self Care Barriers to Discharge: Continued Medical Work up   Patient Goals and CMS Choice Patient states their goals for this hospitalization and ongoing recovery are:: to return to home CMS Medicare.gov Compare Post Acute Care list provided to:: Patient    Expected Discharge Plan and Services Expected Discharge Plan: Home/Self Care                                              Prior Living Arrangements/Services   Lives with:: Self                   Activities of Daily Living      Permission Sought/Granted                  Emotional Assessment              Admission diagnosis:  Syncope [R55] Syncope, unspecified syncope type [R55] Atrial fibrillation, unspecified type [facility]) [I48.91] Patient Active Problem List   Diagnosis Date Noted  . Syncope 10/02/2019   PCP:  System, Pcp Not In Pharmacy:   [facility], [place] [address] Phone: [phone] Fax: [phone]  [id number] [place], [place] [address] [place] [address] Phone: [phone] Fax: [phone]     Social Determinants of Health (SDOH) Interventions    Readmission Risk Interventions No flowsheet data found.

## 2019-10-03 NOTE — Care Management Obs Status (Signed)
[place] NOTIFICATION   Patient Details  Name: Sean Barrett MRN: [id number] Date of Birth: Feb 16, 1934   Medicare Observation Status Notification Given:  Yes    Marilu Favre, RN 10/03/2019, 1:15 PM

## 2019-10-03 NOTE — Evaluation (Signed)
Physical Therapy Evaluation Patient Details Name: Sean Barrett MRN: [id number] DOB: 1933/12/10 Today's Date: 10/03/2019   History of Present Illness  Pt is a 85 year old M with significant PMH of diabetes, HTN, HLD, and a prior CVA who presented after a fall this morning. Admitted for L LE weakness and syncope. CT negative for acute abnormalities, CTA head and neck reveals "calcifications at carotid bifurcations.  Most predominantly posterior circulation-left vertebral artery with severe calcification and left P1 with severe stenosis/occlusion". EEG negative. MRI negative.   Clinical Impression  Pt was seen for assessment of needs to return to [facility] and begin PT.  Pt is unsteady without a walker, has LOB even with walker on the hallway and is demonstrating vitals that do not account for his symptoms.  Has difficulty with spine stiffness to stand up initially that improved with repetition.  BP sitting was 139/71, pulse 77 and sat 96%;  Standing BP was 109/62, pulse 88 and sat 98%.  Shared vitals with nursing and sent recommendations for therapy, and will follow if pt is not discharged today.    Follow Up Recommendations Home health PT;Supervision for mobility/OOB    Equipment Recommendations  Rolling walker with 5" wheels    Recommendations for Other Services       Precautions / Restrictions Precautions Precautions: Fall Precaution Comments: ataxic gait Restrictions Weight Bearing Restrictions: No      Mobility  Bed Mobility Overal bed mobility: Modified Independent                Transfers Overall transfer level: Needs assistance Equipment used: Rolling walker (2 wheeled);1 person hand held assist Transfers: Sit to/from Stand Sit to Stand: Min guard         General transfer comment: minor balance losses without walker and uses effectively to control balance  Ambulation/Gait Ambulation/Gait assistance: Min guard;Min assist Gait Distance (Feet): 100 Feet Assistive  device: Rolling walker (2 wheeled);1 person hand held assist Gait Pattern/deviations: Step-through pattern;Decreased stride length;Wide base of support;Staggering right Gait velocity: reduced Gait velocity interpretation: <1.31 ft/sec, indicative of household ambulator General Gait Details: pt is min guard x short recovery with misstep to R and crossing over on LLE  Stairs            Wheelchair Mobility    Modified Rankin (Stroke Patients Only)       Balance Overall balance assessment: Needs assistance Sitting-balance support: Feet supported Sitting balance-Leahy Scale: Fair     Standing balance support: Bilateral upper extremity supported;During functional activity Standing balance-Leahy Scale: Poor Standing balance comment: more controlled balance with RW, cannot stand with narrow base without external support                             Pertinent Vitals/Pain Pain Assessment: Faces Faces Pain Scale: Hurts even more Pain Location: headache, back pain Pain Descriptors / Indicators: Grimacing Pain Intervention(s): Limited activity within patient's tolerance;Monitored during session;Repositioned    Home Living Family/patient expects to be discharged to:: Private residence Living Arrangements: Alone Available Help at Discharge: Family;Friend(s);Available PRN/intermittently Type of Home: Other(Comment) Home Access: Level entry     Home Layout: Two level Home Equipment: Walker - 4 wheels;Shower seat Additional Comments: visiting from [facility] and will return there tomorrow    Prior Function Level of Independence: Independent         Comments: son reports loss of balance previously at times [place] to R side     Hand  Dominance   Dominant Hand: Right    Extremity/Trunk Assessment   Upper Extremity Assessment Upper Extremity Assessment: Overall WFL for tasks assessed    Lower Extremity Assessment Lower Extremity Assessment: (strength 4- hips, has  coordination changes on LLE)    Cervical / Trunk Assessment Cervical / Trunk Assessment: Other exceptions(lumbar fusion in 2013)  Communication   Communication: No difficulties  Cognition Arousal/Alertness: Awake/alert Behavior During Therapy: WFL for tasks assessed/performed Overall Cognitive Status: Within Functional Limits for tasks assessed                                 General Comments: LL E is uncoordinated with RAM and with reciprocal stepping at times      General Comments General comments (skin integrity, edema, etc.): reviewed vitals to ensure BP not the source of dizziness, nor was pulse or sats    Exercises     Assessment/Plan HHPT to follow up with balance training, then on to outpatient therapy for same.   PT Assessment Patient needs continued PT services  PT Problem List Decreased strength;Decreased range of motion;Decreased activity tolerance;Decreased balance;Decreased mobility;Decreased coordination;Cardiopulmonary status limiting activity;Pain       PT Treatment Interventions DME instruction;Gait training;Stair training;Functional mobility training;Therapeutic activities;Therapeutic exercise;Balance training;Neuromuscular re-education;Patient/family education    PT Goals (Current goals can be found in the Care Plan section)  Acute Rehab PT Goals Patient Stated Goal: to get home  PT Goal Formulation: With patient/family Time For Goal Achievement: 10/10/19 Potential to Achieve Goals: Good    Frequency Min 3X/week   Barriers to discharge Decreased caregiver support;Inaccessible home environment has stairs and lives alone    Co-evaluation               AM-PAC PT "6 Clicks" Mobility  Outcome Measure Help needed turning from your back to your side while in a flat bed without using bedrails?: None Help needed moving from lying on your back to sitting on the side of a flat bed without using bedrails?: None Help needed moving to and  from a bed to a chair (including a wheelchair)?: A Little Help needed standing up from a chair using your arms (e.g., wheelchair or bedside chair)?: A Little Help needed to walk in hospital room?: A Little Help needed climbing 3-5 steps with a railing? : A Lot 6 Click Score: 19    End of Session Equipment Utilized During Treatment: Gait belt Activity Tolerance: Patient limited by pain;Treatment limited secondary to medical complications (Comment) Patient left: in bed;with call bell/phone within reach;with bed alarm set;with family/visitor present Nurse Communication: Mobility status PT Visit Diagnosis: Unsteadiness on feet (R26.81);Muscle weakness (generalized) (M62.81);History of falling (Z91.81)    Time: 7902-4097 PT Time Calculation (min) (ACUTE ONLY): 25 min   Charges:   PT Evaluation $PT Eval Moderate Complexity: 1 Mod PT Treatments $Gait Training: 8-22 mins       Ivar Drape 10/03/2019, 1:18 PM   Samul Dada, PT MS Acute Rehab Dept. Number: [facility] [id number] and [facility] [phone]

## 2019-10-03 NOTE — Progress Notes (Signed)
Pt son at bedside and would like for the MD to speak with him. MD paged and Dr. Ronnald Ramp on phone speaking with pt son.

## 2020-11-05 DEATH — deceased

## 2021-06-20 IMAGING — CT CT ANGIO NECK
2 of 7 series · 8 of 33 positions shown · IV contrast (OMNI 350)
Comparison: Noncontrast head CT performed earlier the same day
10/02/2019

CLINICAL DATA: Stroke, follow-up.

EXAM:
CT ANGIOGRAPHY HEAD AND NECK
TECHNIQUE: Multidetector CT imaging of the head and neck was performed using
the standard protocol during bolus administration of intravenous
contrast. Multiplanar CT image reconstructions and MIPs were
obtained to evaluate the vascular anatomy. Carotid stenosis
measurements (when applicable) are obtained utilizing NASCET
criteria, using the distal internal carotid diameter as the
denominator.
CONTRAST:  75mL OMNIPAQUE IOHEXOL 350 MG/ML SOLN

[Series 5: cta neck · axial · 0.49mm/px · z∈[-218,-94]mm · 2 of 188 slices shown]
[im 63/188  soft-tissue]
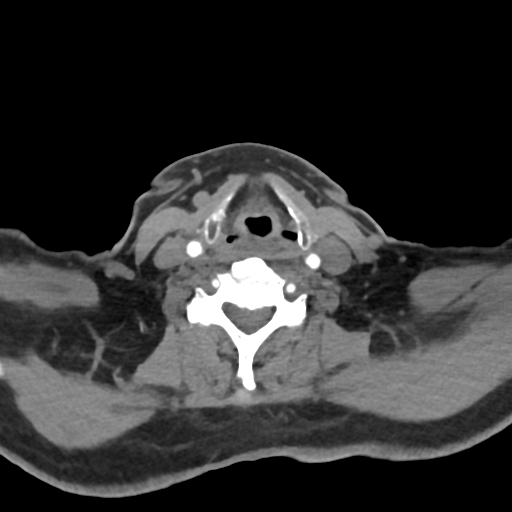
[im 125/188  soft-tissue]
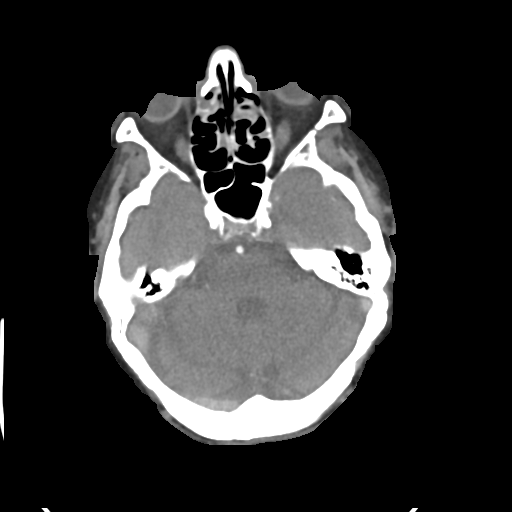

[Series 7: cta neck axial · axial · 0.39mm/px · z∈[-290,-27]mm · 6 of 369 slices shown]
[im 53/369  soft-tissue]
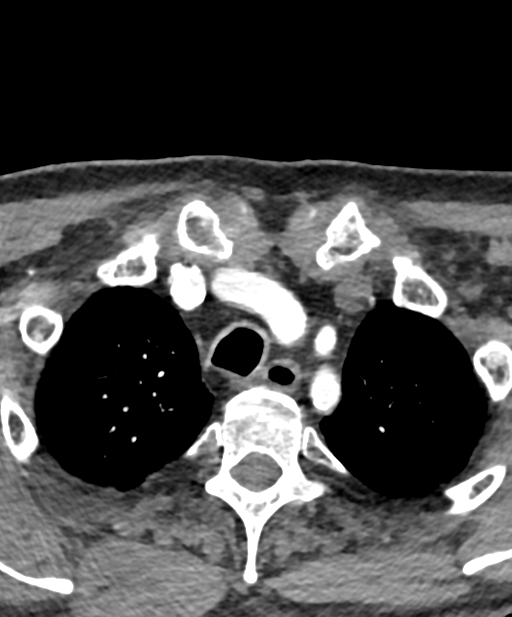
[im 106/369  bone]
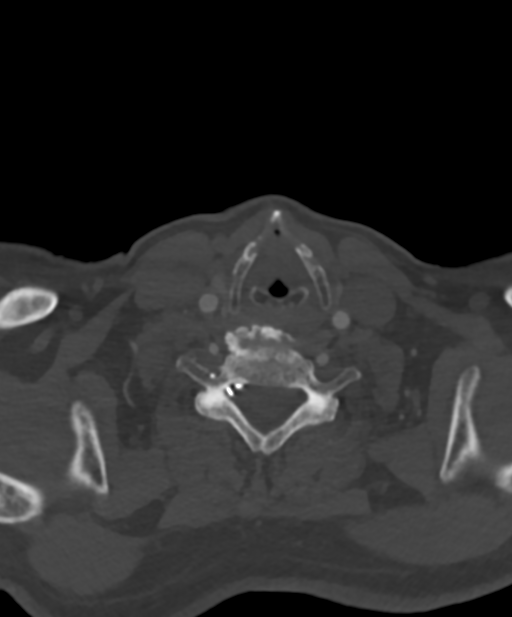
[im 158/369  soft-tissue]
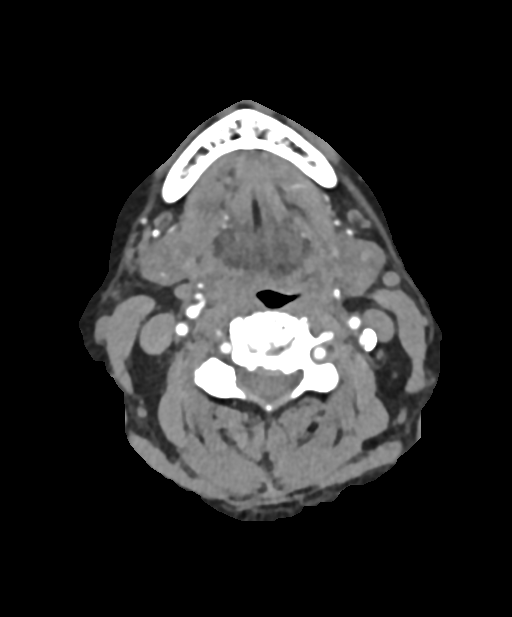
[im 211/369  bone]
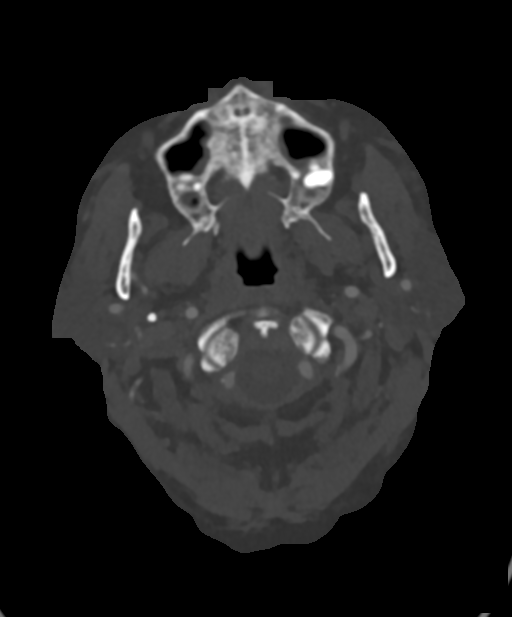
[im 263/369  soft-tissue]
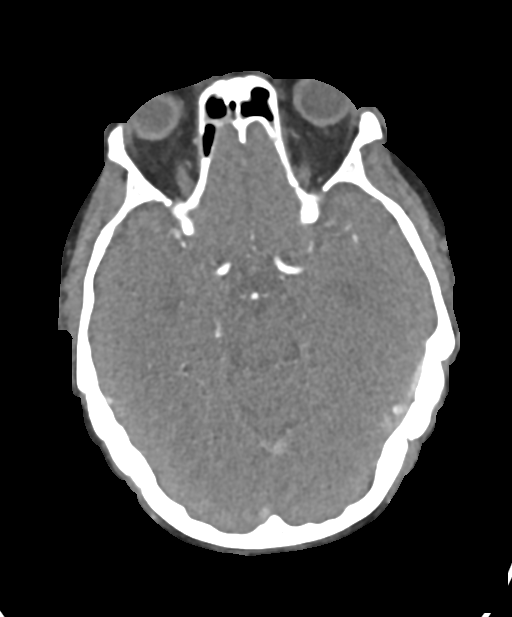
[im 316/369  bone]
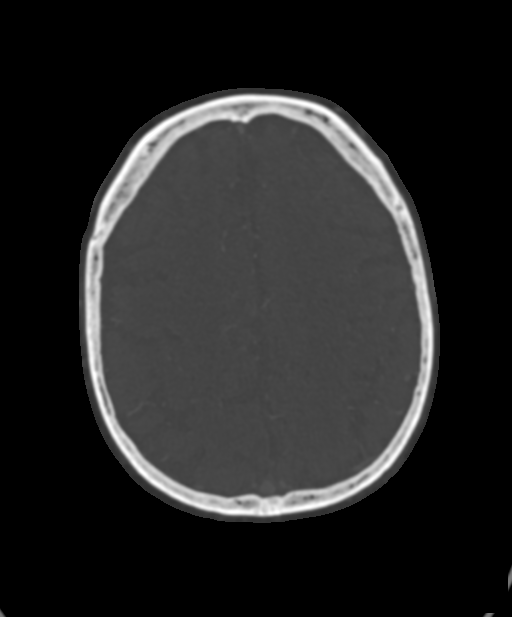

[8 of 33 positions shown; findings below may reference images not displayed]

FINDINGS: CTA NECK FINDINGS

Aortic arch: Standard aortic branching. Scattered soft and calcified
plaque within the visualized aortic arch and proximal major branch
vessels of the neck.

Right carotid system: CCA patent without stenosis. Prominent soft
and calcified plaque at the carotid bifurcation and within the
proximal ICA. There is resultant 50-60% stenosis at the origin of
the ICA. Calcified plaques also present within the distal cervical
ICA, although without significant narrowing at this site.

Left carotid system: CCA patent without stenosis. Moderate soft and
calcified plaque at the carotid bifurcation and within the proximal
ICA. Resultant less than 50% stenosis within the proximal ICA.
Distal to this, the ICA is patent within the neck without stenosis.

Vertebral arteries: The left vertebral artery is slightly dominant.
Calcified plaque at the origin of the non dominant right vertebral
artery results in at least moderate/severe ostial stenosis.
Calcified plaque also present at the origin of the dominant left
vertebral artery without significant ostial stenosis. More distally
the bilateral vertebral arteries are patent within the neck without
significant stenosis.

Skeleton: No acute bony abnormality. Cervical spondylosis without
high-grade bony spinal canal narrowing.

Other neck: No soft tissue neck mass or cervical lymphadenopathy.

Upper chest: No consolidation within the imaged lung apices.

Review of the MIP images confirms the above findings

CTA HEAD FINDINGS

Anterior circulation:

There is dense calcified plaque throughout the bilateral internal
carotid artery siphons. There is moderate/severe narrowing of the
paraclinoid segments bilaterally (greater on the left).

The M1 right middle cerebral artery is patent without significant
stenosis. Atherosclerotic irregularity of right M2 branches. Most
notably, there is a moderate focal stenosis within a proximal to mid
M2 right MCA branch (series 12, image 13). The right anterior
cerebral artery is patent without significant proximal stenosis.

The M1 left middle cerebral artery is patent without significant
stenosis. Atherosclerotic irregularity of M2 left MCA branches. This
includes mild-to-moderate stenosis at the origin of a left M2 MCA
branch vessel (series 11, image 20). The left anterior cerebral
artery is patent without significant proximal stenosis.

No intracranial aneurysm is identified.

Posterior circulation:

Mixed plaque throughout the intracranial vertebral arteries
bilaterally. Sites of up to moderate stenosis within the dominant
intracranial left vertebral artery. Sites of up to moderate/severe
stenosis within the intracranial right vertebral artery. The basilar
artery is patent without significant stenosis. The right posterior
cerebral artery is patent. Moderate atherosclerotic irregularity
throughout the majority of the P2 right posterior cerebral artery.
The distal P1 left posterior cerebral artery is poorly delineated as
are much of the P2 and P3 left PCA segments. Findings consistent
with high-grade stenosis and/or partial occlusion. There is some
flow seen within the distal left posterior cerebral artery. Faint
enhancement within a small left posterior communicating artery is
visualized. No right posterior communicating artery is seen.

Venous sinuses: Within limitations of contrast timing, no convincing
thrombus.

Anatomic variants: As described

Review of the MIP images confirms the above findings
IMPRESSION: CTA neck:

1. The common carotid, internal carotid and vertebral arteries are
patent within the neck bilaterally.
2. Mixed plaque results in [DATE]% stenosis within the proximal right
ICA, and less than 50% stenosis of the proximal left ICA.
3. Calcified plaque results in moderate/severe ostial stenosis at
the origin of the non-dominant right vertebral artery.

CTA head:

1. Intracranial atherosclerotic disease, most notably as follows.
2. The distal P1 left posterior cerebral artery and much of the P2
and P3 left PCA segments are poorly delineated. Findings consistent
with high-grade stenosis and/or partial occlusion. Some enhancement
is seen within the distal left PCA.
3. Moderate atherosclerotic irregularity of the P2 right PCA.
4. Mixed plaque within the intracranial vertebral arteries with
sites of up to moderate/severe stenosis on the right, and up to
moderate stenosis on the left.
5. Dense calcified plaque results in moderate/severe stenoses within
the paraclinoid internal carotid arteries (greater on the left).
6. Moderate focal stenosis within a mid to distal M2 right MCA
branch vessel.
7. Mild/moderate focal stenosis at the origin of an M2 left MCA
branch vessel.
